# Patient Record
Sex: Female | Born: 1990 | Race: Black or African American | Hispanic: No | Marital: Single | Smoking: Former smoker
Health system: Southern US, Community
[De-identification: ages and names within clinical notes are randomized; demographics above are authoritative.]

## PROBLEM LIST (undated history)

## (undated) ENCOUNTER — Inpatient Hospital Stay: Payer: Self-pay

## (undated) DIAGNOSIS — Z789 Other specified health status: Secondary | ICD-10-CM

## (undated) HISTORY — PX: NO PAST SURGERIES: SHX2092

---
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
## 2012-02-29 ENCOUNTER — Emergency Department (HOSPITAL_COMMUNITY)
Admission: EM | Admit: 2012-02-29 | Discharge: 2012-02-29 | Disposition: A | Discharge status: Home / Self Care | Payer: 59 | Attending: Emergency Medicine | Admitting: Emergency Medicine

## 2012-02-29 ENCOUNTER — Emergency Department (HOSPITAL_COMMUNITY): Payer: 59

## 2012-02-29 ENCOUNTER — Encounter (HOSPITAL_COMMUNITY): Payer: Self-pay | Admitting: Emergency Medicine

## 2012-02-29 DIAGNOSIS — S62339A Displaced fracture of neck of unspecified metacarpal bone, initial encounter for closed fracture: Secondary | ICD-10-CM

## 2012-02-29 DIAGNOSIS — S62309A Unspecified fracture of unspecified metacarpal bone, initial encounter for closed fracture: Secondary | ICD-10-CM | POA: Insufficient documentation

## 2012-02-29 DIAGNOSIS — F17200 Nicotine dependence, unspecified, uncomplicated: Secondary | ICD-10-CM | POA: Insufficient documentation

## 2012-02-29 DIAGNOSIS — X838XXA Intentional self-harm by other specified means, initial encounter: Secondary | ICD-10-CM | POA: Insufficient documentation

## 2012-02-29 MED ORDER — HYDROCODONE-ACETAMINOPHEN 5-325 MG PO TABS: 1.0000 | ORAL_TABLET | ORAL | Status: AC | PRN
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
## 2012-02-29 NOTE — ED Provider Notes (Signed)
Medical screening examination/treatment/procedure(s) were performed by non-physician practitioner and as supervising physician I was immediately available for consultation/collaboration.   Hurman Horn, MD 02/29/12 2051

## 2012-06-24 NOTE — L&D Delivery Note (Signed)
Delivery Note  At 3:02 PM a viable and healthy female [facility])  was delivered via Vaginal, Spontaneous Delivery (Presentation:Vertex,  Right Occiput Anterior).  APGAR: 8, 3; weight .   Placenta status: Intact, .  Cord: 3 vessels with the following complications: Apneic episode after delivery.    Anesthesia: Epidural  Episiotomy: None Lacerations: None Est. Blood Loss (mL): 300  Mom to postpartum.  Baby to Couplet care / Skin to Skin.  Catherine Bonilla V 04/29/2013, 3:40 PM

## 2012-12-03 LAB — OB RESULTS CONSOLE HGB/HCT, BLOOD
HCT: 31 %
Hemoglobin: 10.1 g/dL

## 2012-12-03 LAB — OB RESULTS CONSOLE HEPATITIS B SURFACE ANTIGEN: Hepatitis B Surface Ag: NEGATIVE

## 2012-12-03 LAB — OB RESULTS CONSOLE GC/CHLAMYDIA: Gonorrhea: NEGATIVE

## 2012-12-03 LAB — OB RESULTS CONSOLE RUBELLA ANTIBODY, IGM: Rubella: IMMUNE

## 2012-12-03 LAB — OB RESULTS CONSOLE ABO/RH

## 2013-01-04 LAB — OB RESULTS CONSOLE GC/CHLAMYDIA
Chlamydia: NEGATIVE
Gonorrhea: NEGATIVE

## 2013-01-10 ENCOUNTER — Encounter (HOSPITAL_COMMUNITY): Payer: Self-pay | Admitting: *Deleted

## 2013-01-10 DIAGNOSIS — F17200 Nicotine dependence, unspecified, uncomplicated: Secondary | ICD-10-CM | POA: Insufficient documentation

## 2013-01-10 DIAGNOSIS — O219 Vomiting of pregnancy, unspecified: Secondary | ICD-10-CM | POA: Insufficient documentation

## 2013-01-10 LAB — COMPREHENSIVE METABOLIC PANEL
Albumin: 3.2 g/dL — ABNORMAL LOW (ref 3.5–5.2)
BUN: 13 mg/dL (ref 6–23)
Calcium: 9 mg/dL (ref 8.4–10.5)
Chloride: 102 mEq/L (ref 96–112)
Creatinine, Ser: 0.48 mg/dL — ABNORMAL LOW (ref 0.50–1.10)
Total Bilirubin: 0.4 mg/dL (ref 0.3–1.2)
Total Protein: 7.4 g/dL (ref 6.0–8.3)

## 2013-01-10 LAB — CBC WITH DIFFERENTIAL/PLATELET
Eosinophils Relative: 0 % (ref 0–5)
Hemoglobin: 11.1 g/dL — ABNORMAL LOW (ref 12.0–15.0)
Lymphocytes Relative: 12 % (ref 12–46)
Lymphs Abs: 1.3 10*3/uL (ref 0.7–4.0)
MCV: 84.1 fL (ref 78.0–100.0)
Platelets: 186 10*3/uL (ref 150–400)
RBC: 3.84 MIL/uL — ABNORMAL LOW (ref 3.87–5.11)
WBC: 11 10*3/uL — ABNORMAL HIGH (ref 4.0–10.5)

## 2013-01-10 NOTE — ED Notes (Signed)
Pt c/o n/v x 3 since 1300. Pt states [redacted] weeks pregnant, will be second child, with no miscarriages.

## 2013-01-11 ENCOUNTER — Emergency Department (HOSPITAL_COMMUNITY)
Admission: EM | Admit: 2013-01-11 | Discharge: 2013-01-11 | Discharge status: Against Medical Advice | Payer: Medicaid Other | Attending: Emergency Medicine | Admitting: Emergency Medicine

## 2013-01-11 NOTE — ED Notes (Signed)
Pts name called to go to a room no answer 

## 2013-01-11 NOTE — ED Notes (Signed)
No answer, not in w/r, b/r, entryway, triage or h/w, no answer, unable to find.

## 2013-04-09 LAB — OB RESULTS CONSOLE GBS: GBS: NEGATIVE

## 2013-04-29 ENCOUNTER — Inpatient Hospital Stay (HOSPITAL_COMMUNITY): Payer: Medicaid Other | Admitting: Anesthesiology

## 2013-04-29 ENCOUNTER — Inpatient Hospital Stay (HOSPITAL_COMMUNITY)
Admission: AD | Admit: 2013-04-29 | Discharge: 2013-05-01 | DRG: 775 | Disposition: A | Discharge status: Home / Self Care | Payer: Medicaid Other | Source: Ambulatory Visit | Attending: Obstetrics and Gynecology | Admitting: Obstetrics and Gynecology

## 2013-04-29 ENCOUNTER — Encounter (HOSPITAL_COMMUNITY): Payer: Self-pay

## 2013-04-29 ENCOUNTER — Encounter (HOSPITAL_COMMUNITY): Payer: Medicaid Other | Admitting: Anesthesiology

## 2013-04-29 DIAGNOSIS — R0681 Apnea, not elsewhere classified: Secondary | ICD-10-CM | POA: Diagnosis not present

## 2013-04-29 DIAGNOSIS — O99893 Other specified diseases and conditions complicating puerperium: Principal | ICD-10-CM | POA: Diagnosis not present

## 2013-04-29 HISTORY — DX: Other specified health status: Z78.9

## 2013-04-29 LAB — CBC
HCT: 30.2 % — ABNORMAL LOW (ref 36.0–46.0)
MCH: 26.3 pg (ref 26.0–34.0)
MCV: 78.6 fL (ref 78.0–100.0)
RBC: 3.84 MIL/uL — ABNORMAL LOW (ref 3.87–5.11)
RDW: 14.7 % (ref 11.5–15.5)
WBC: 12 10*3/uL — ABNORMAL HIGH (ref 4.0–10.5)

## 2013-04-29 LAB — RPR: RPR Ser Ql: NONREACTIVE

## 2013-04-29 LAB — TYPE AND SCREEN: Antibody Screen: NEGATIVE

## 2013-04-29 MED ORDER — SENNOSIDES-DOCUSATE SODIUM 8.6-50 MG PO TABS
2.0000 | ORAL_TABLET | ORAL | Status: DC
  Administered 2013-04-30: 2 via ORAL
  Filled 2013-04-29: qty 2

## 2013-04-29 MED ORDER — OXYTOCIN BOLUS FROM INFUSION: 500.0000 mL | INTRAVENOUS | Status: DC

## 2013-04-29 MED ORDER — IBUPROFEN 600 MG PO TABS
600.0000 mg | ORAL_TABLET | Freq: Four times a day (QID) | ORAL | Status: DC
  Administered 2013-04-30 – 2013-05-01 (×6): 600 mg via ORAL
  Filled 2013-04-29 (×7): qty 1

## 2013-04-29 MED ORDER — ONDANSETRON HCL 4 MG PO TABS: 4.0000 mg | ORAL_TABLET | ORAL | Status: DC | PRN

## 2013-04-29 MED ORDER — SIMETHICONE 80 MG PO CHEW: 80.0000 mg | CHEWABLE_TABLET | ORAL | Status: DC | PRN

## 2013-04-29 MED ORDER — ONDANSETRON HCL 4 MG/2ML IJ SOLN: 4.0000 mg | INTRAMUSCULAR | Status: DC | PRN

## 2013-04-29 MED ORDER — ACETAMINOPHEN 325 MG PO TABS: 650.0000 mg | ORAL_TABLET | ORAL | Status: DC | PRN

## 2013-04-29 MED ORDER — FENTANYL 2.5 MCG/ML BUPIVACAINE 1/10 % EPIDURAL INFUSION (WH - ANES)
INTRAMUSCULAR | Status: DC | PRN
  Administered 2013-04-29: 14 mL/h via EPIDURAL

## 2013-04-29 MED ORDER — EPHEDRINE 5 MG/ML INJ
10.0000 mg | INTRAVENOUS | Status: DC | PRN
  Filled 2013-04-29: qty 4
  Filled 2013-04-29: qty 2

## 2013-04-29 MED ORDER — FENTANYL CITRATE 0.05 MG/ML IJ SOLN
100.0000 ug | INTRAMUSCULAR | Status: DC | PRN
  Administered 2013-04-29: 100 ug via INTRAVENOUS
  Filled 2013-04-29: qty 2

## 2013-04-29 MED ORDER — PHENYLEPHRINE 40 MCG/ML (10ML) SYRINGE FOR IV PUSH (FOR BLOOD PRESSURE SUPPORT)
80.0000 ug | PREFILLED_SYRINGE | INTRAVENOUS | Status: DC | PRN
  Filled 2013-04-29: qty 2

## 2013-04-29 MED ORDER — DIPHENHYDRAMINE HCL 25 MG PO CAPS: 25.0000 mg | ORAL_CAPSULE | Freq: Four times a day (QID) | ORAL | Status: DC | PRN

## 2013-04-29 MED ORDER — SENNOSIDES-DOCUSATE SODIUM 8.6-50 MG PO TABS: 2.0000 | ORAL_TABLET | ORAL | Status: DC

## 2013-04-29 MED ORDER — OXYCODONE-ACETAMINOPHEN 5-325 MG PO TABS: 1.0000 | ORAL_TABLET | ORAL | Status: DC | PRN

## 2013-04-29 MED ORDER — MEDROXYPROGESTERONE ACETATE 150 MG/ML IM SUSP: 150.0000 mg | INTRAMUSCULAR | Status: DC | PRN

## 2013-04-29 MED ORDER — DIPHENHYDRAMINE HCL 50 MG/ML IJ SOLN: 12.5000 mg | INTRAMUSCULAR | Status: DC | PRN

## 2013-04-29 MED ORDER — WITCH HAZEL-GLYCERIN EX PADS: 1.0000 "application " | MEDICATED_PAD | CUTANEOUS | Status: DC | PRN

## 2013-04-29 MED ORDER — FLEET ENEMA 7-19 GM/118ML RE ENEM: 1.0000 | ENEMA | Freq: Every day | RECTAL | Status: DC | PRN

## 2013-04-29 MED ORDER — PHENYLEPHRINE 40 MCG/ML (10ML) SYRINGE FOR IV PUSH (FOR BLOOD PRESSURE SUPPORT)
80.0000 ug | PREFILLED_SYRINGE | INTRAVENOUS | Status: DC | PRN
  Filled 2013-04-29: qty 2
  Filled 2013-04-29: qty 10

## 2013-04-29 MED ORDER — PRENATAL MULTIVITAMIN CH
1.0000 | ORAL_TABLET | Freq: Every day | ORAL | Status: DC
  Administered 2013-04-30: 1 via ORAL
  Filled 2013-04-29: qty 1

## 2013-04-29 MED ORDER — ZOLPIDEM TARTRATE 5 MG PO TABS: 5.0000 mg | ORAL_TABLET | Freq: Every evening | ORAL | Status: DC | PRN

## 2013-04-29 MED ORDER — LANOLIN HYDROUS EX OINT: TOPICAL_OINTMENT | CUTANEOUS | Status: DC | PRN

## 2013-04-29 MED ORDER — MEASLES, MUMPS & RUBELLA VAC ~~LOC~~ INJ: 0.5000 mL | INJECTION | Freq: Once | SUBCUTANEOUS | Status: DC

## 2013-04-29 MED ORDER — OXYTOCIN 40 UNITS IN LACTATED RINGERS INFUSION - SIMPLE MED: 62.5000 mL/h | INTRAVENOUS | Status: DC

## 2013-04-29 MED ORDER — CITRIC ACID-SODIUM CITRATE 334-500 MG/5ML PO SOLN: 30.0000 mL | ORAL | Status: DC | PRN

## 2013-04-29 MED ORDER — BENZOCAINE-MENTHOL 20-0.5 % EX AERO: 1.0000 "application " | INHALATION_SPRAY | CUTANEOUS | Status: DC | PRN

## 2013-04-29 MED ORDER — ONDANSETRON HCL 4 MG/2ML IJ SOLN: 4.0000 mg | Freq: Four times a day (QID) | INTRAMUSCULAR | Status: DC | PRN

## 2013-04-29 MED ORDER — BISACODYL 10 MG RE SUPP: 10.0000 mg | Freq: Every day | RECTAL | Status: DC | PRN

## 2013-04-29 MED ORDER — DIBUCAINE 1 % RE OINT: 1.0000 "application " | TOPICAL_OINTMENT | RECTAL | Status: DC | PRN

## 2013-04-29 MED ORDER — LACTATED RINGERS IV SOLN: 500.0000 mL | Freq: Once | INTRAVENOUS | Status: DC

## 2013-04-29 MED ORDER — TERBUTALINE SULFATE 1 MG/ML IJ SOLN: 0.2500 mg | Freq: Once | INTRAMUSCULAR | Status: DC | PRN

## 2013-04-29 MED ORDER — LIDOCAINE HCL (PF) 1 % IJ SOLN
INTRAMUSCULAR | Status: DC | PRN
  Administered 2013-04-29 (×2): 9 mL

## 2013-04-29 MED ORDER — FENTANYL 2.5 MCG/ML BUPIVACAINE 1/10 % EPIDURAL INFUSION (WH - ANES)
14.0000 mL/h | INTRAMUSCULAR | Status: DC | PRN
  Filled 2013-04-29 (×2): qty 125

## 2013-04-29 MED ORDER — OXYTOCIN 40 UNITS IN LACTATED RINGERS INFUSION - SIMPLE MED
1.0000 m[IU]/min | INTRAVENOUS | Status: DC
  Administered 2013-04-29: 2 m[IU]/min via INTRAVENOUS
  Filled 2013-04-29: qty 1000

## 2013-04-29 MED ORDER — LACTATED RINGERS IV SOLN: 500.0000 mL | INTRAVENOUS | Status: DC | PRN

## 2013-04-29 MED ORDER — IBUPROFEN 600 MG PO TABS: 600.0000 mg | ORAL_TABLET | Freq: Four times a day (QID) | ORAL | Status: DC

## 2013-04-29 MED ORDER — IBUPROFEN 600 MG PO TABS
600.0000 mg | ORAL_TABLET | Freq: Four times a day (QID) | ORAL | Status: DC | PRN
  Administered 2013-04-29: 600 mg via ORAL
  Filled 2013-04-29: qty 1

## 2013-04-29 MED ORDER — LACTATED RINGERS IV SOLN
INTRAVENOUS | Status: DC
  Administered 2013-04-29: 06:00:00 via INTRAVENOUS

## 2013-04-29 MED ORDER — OXYCODONE-ACETAMINOPHEN 5-325 MG PO TABS
1.0000 | ORAL_TABLET | ORAL | Status: DC | PRN
  Administered 2013-04-30: 1 via ORAL
  Administered 2013-04-30: 2 via ORAL
  Filled 2013-04-29: qty 1
  Filled 2013-04-29: qty 2

## 2013-04-29 MED ORDER — TETANUS-DIPHTH-ACELL PERTUSSIS 5-2.5-18.5 LF-MCG/0.5 IM SUSP: 0.5000 mL | Freq: Once | INTRAMUSCULAR | Status: DC

## 2013-04-29 MED ORDER — EPHEDRINE 5 MG/ML INJ
10.0000 mg | INTRAVENOUS | Status: DC | PRN
  Filled 2013-04-29: qty 2

## 2013-04-29 MED ORDER — OXYCODONE-ACETAMINOPHEN 5-325 MG PO TABS
1.0000 | ORAL_TABLET | ORAL | Status: DC | PRN
  Administered 2013-04-29: 2 via ORAL
  Filled 2013-04-29: qty 2

## 2013-04-29 MED ORDER — LIDOCAINE HCL (PF) 1 % IJ SOLN
30.0000 mL | INTRAMUSCULAR | Status: DC | PRN
  Filled 2013-04-29 (×2): qty 30

## 2013-04-29 MED ORDER — PRENATAL MULTIVITAMIN CH: 1.0000 | ORAL_TABLET | Freq: Every day | ORAL | Status: DC

## 2013-04-29 NOTE — Lactation Note (Signed)
This note was copied from the chart of Catherine Bonilla. Lactation Consultation Note  Patient Name: Catherine Sharnette Kitamura NWGNF'A Date: 04/29/2013 Reason for consult: Initial assessment of this mom and baby at 7 hours after delivery. Mom is about to go to sleep and baby was recently fed formula for a low blood sugar but had previously latched well to breast.  Initial LATCH score=9 per RN assessment.  LC encouraged STS and cue feedings. Mom states she was taught how to hand express by her nurse.LC encouraged review of Baby and Me pp 14 and 20-25 for STS and BF information. LC provided [employer] brochure and reviewed [facility] services and list of community and web site resources.     Maternal Data Formula Feeding for Exclusion: No Infant to breast within first hour of birth: Yes (initial LATCH score=9) Has patient been taught Hand Expression?: Yes (mom states her nurse showed her hand expression) Does the patient have breastfeeding experience prior to this delivery?: No  Feeding Feeding Type: Formula Length of feed: 5 min  LATCH Score/Interventions Latch: Repeated attempts needed to sustain latch, nipple held in mouth throughout feeding, stimulation needed to elicit sucking reflex. Intervention(s): Adjust position;Assist with latch  Audible Swallowing: A few with stimulation Intervention(s): Skin to skin  Type of Nipple: Flat (but compressible) Intervention(s): No intervention needed  Comfort (Breast/Nipple): Soft / non-tender     Hold (Positioning): Assistance needed to correctly position infant at breast and maintain latch. Intervention(s): Breastfeeding basics reviewed;Support Pillows;Position options;Skin to skin  LATCH Score: 6 (previous assessment per nurse)  Lactation Tools Discussed/Used   STS, hand expression, cue feedings  Consult Status Consult Status: Follow-up Date: 04/30/13 Follow-up type: In-patient    Warrick Parisian [facility] 04/29/2013, 10:49 PM

## 2013-04-29 NOTE — Progress Notes (Signed)
22 y.o. year old female,at [redacted]w[redacted]d gestation.  SUBJECTIVE:  Comfortable with epidural.  OBJECTIVE:  BP 122/72  Pulse 70  Temp(Src) 97.8 F (36.6 C) (Oral)  Resp 18  Ht 5\' 3"  (1.6 m)  Wt 127 lb 9.6 oz (57.879 kg)  BMI 22.61 kg/m2  SpO2 100%  Fetal Heart Tones:  Category 2  Contractions:          Firm  Cervix is now 6-7 cm. Intrauterine pressure catheter placed. We'll begin amnioinfusion. Cesarean section discussed with the patient and family. Risk and benefits were reviewed.  ASSESSMENT:  [redacted]w[redacted]d Weeks Pregnancy  Fetal heart tones category 2.  PLAN:  Amnioinfusion.  Pitocin has now been started. Fetal heart tones continued to be category 2. We're still hoping for a vaginal delivery.  Leonard Schwartz, M.D.

## 2013-04-29 NOTE — MAU Note (Signed)
Pt c/o uc that started at 9pm and are every 9 mins apart.

## 2013-04-29 NOTE — MAU Note (Signed)
Recheck pt cervix in 1 hour per Gevena Barre CNM

## 2013-04-29 NOTE — Progress Notes (Signed)
Gevena Barre, CNM given update regarding pt arrival & status.  CNM informed pt cervix 4.5 cms, given IV pain meds, & will want epidural.  CNM verbalized understanding

## 2013-04-29 NOTE — H&P (Signed)
Catherine Bonilla is a 22 y.o. female presenting for labor, denies VB or LOF, GFM, requests epidural.   Pregnancy course: Pt began PNC at [facility] at 19wks, +chlamydia and tx'd, TOC neg  Anatomy US normal  Quad screen WNl  [place] at 32wks noted AC lag F/u growth [place] at 34wks WNL EFW 54% and no AC lag GBS/GC/CT neg at 36wks   Maternal Medical History:  Reason for admission: Contractions.   Contractions: Onset was 3-5 hours ago.   Frequency: regular.   Perceived severity is mild.    Fetal activity: Perceived fetal activity is normal.   Last perceived fetal movement was within the past hour.    Prenatal complications: no prenatal complications Prenatal Complications - Diabetes: none.    OB History   Grav Para Term Preterm Abortions TAB SAB Ect Mult Living   2 1 1       1      No past medical history on file. History reviewed. No pertinent past surgical history. Family History: family history is not on file. Social History:  reports that she has quit smoking. She has never used smokeless tobacco. She reports that she does not drink alcohol or use illicit drugs.   Prenatal Transfer Tool  Maternal Diabetes: No Genetic Screening: Normal Maternal Ultrasounds/Referrals: Normal Fetal Ultrasounds or other Referrals:  None Maternal Substance Abuse:  No Significant Maternal Medications:  None Significant Maternal Lab Results:  Lab values include: Group B Strep negative Other Comments:  None  Review of Systems  All other systems reviewed and are negative.    Dilation: 3.5 Effacement (%): 90;100 Station: -2;-1 Exam by:: D Simpson RN Blood pressure 121/76, pulse 76, temperature 98.6 F (37 C), temperature source Oral, resp. rate 18, height 5\' 3"  (1.6 Bonilla), weight 127 lb 9.6 oz (57.879 kg), SpO2 100.00%. Maternal Exam:  Uterine Assessment: Contraction strength is mild.  Contraction frequency is regular.   Abdomen: Patient reports no abdominal tenderness. Fundal height is aga.   Estimated  fetal weight is 7-8#.   Fetal presentation: vertex  Introitus: Normal vulva. Normal vagina.  Cervix: Cervix evaluated by digital exam.     Fetal Exam Fetal Monitor Review: Mode: ultrasound.    Fetal State Assessment: Category I - tracings are normal.     Physical Exam  Nursing note and vitals reviewed. Constitutional: She is oriented to person, place, and time. She appears well-developed and well-nourished.  HENT:  Head: Normocephalic.  Eyes: Pupils are equal, round, and reactive to light.  Neck: Normal range of motion.  Cardiovascular: Normal rate, regular rhythm and normal heart sounds.   Respiratory: Effort normal and breath sounds normal.  GI: Soft. Bowel sounds are normal.  Genitourinary: Vagina normal.  Musculoskeletal: Normal range of motion.  Neurological: She is alert and oriented to person, place, and time. She has normal reflexes.  Skin: Skin is warm and dry.  Psychiatric: She has a normal mood and affect. Her behavior is normal.    Prenatal labs: ABO, Rh: O/Positive/-- (06/12 0000) Antibody:   neg Rubella: Immune (06/12 0000) RPR: Nonreactive (06/12 0000)  HBsAg: Negative (06/12 0000)  HIV: Non-reactive (06/12 0000)  GBS: Negative (10/17 0000)   Assessment/Plan: IUP at [redacted]w[redacted]d Early labor GBS neg FHR cat 1  Admit to b.s per c/w Dr Su Hilt Routine L&D orders Expectant mgmnt     Catherine Bonilla 04/29/2013, 4:10 AM

## 2013-04-29 NOTE — Anesthesia Procedure Notes (Signed)
Epidural Patient location during procedure: OB Start time: 04/29/2013 8:02 AM End time: 04/29/2013 8:06 AM  Staffing Anesthesiologist: Leilani Able Performed by: anesthesiologist   Preanesthetic Checklist Completed: patient identified, site marked, surgical consent, pre-op evaluation, timeout performed, IV checked, risks and benefits discussed and monitors and equipment checked  Epidural Patient position: sitting Prep: site prepped and draped and DuraPrep Patient monitoring: continuous pulse ox and blood pressure Approach: midline Injection technique: LOR air  Needle:  Needle type: Tuohy  Needle gauge: 17 G Needle length: 9 cm and 9 Needle insertion depth: 5 cm cm Catheter type: closed end flexible Catheter size: 19 Gauge Catheter at skin depth: 10 cm Test dose: negative and Other  Assessment Sensory level: T9 Events: blood not aspirated, injection not painful, no injection resistance, negative IV test and no paresthesia

## 2013-04-29 NOTE — Progress Notes (Signed)
Lab tech into draw blood secondary original specimens inadequate to run

## 2013-04-29 NOTE — Progress Notes (Signed)
22 y.o. year old female,at [redacted]w[redacted]d gestation.  SUBJECTIVE:  The patient's pain is better with an epidural.  OBJECTIVE:  BP 101/65  Pulse 79  Temp(Src) 97.8 F (36.6 C) (Oral)  Resp 18  Ht 5\' 3"  (1.6 m)  Wt 127 lb 9.6 oz (57.879 kg)  BMI 22.61 kg/m2  SpO2 100%  Fetal Heart Tones:  Category 1  Contractions:          Regular  Cervix is 7 cm, 80% effaced, -2.\  Membranes ruptured. Fluid clear.  ASSESSMENT:  [redacted]w[redacted]d Weeks Pregnancy  Active labor  PLAN:  Anticipate a vaginal delivery.  Leonard Schwartz, M.D.

## 2013-04-29 NOTE — MAU Note (Signed)
Pt may come off monitor per Gevena Barre CNM

## 2013-04-29 NOTE — Anesthesia Preprocedure Evaluation (Addendum)
Anesthesia Evaluation  Patient identified by MRN, date of birth, ID band Patient awake    Reviewed: Allergy & Precautions, H&P , NPO status , Patient's Chart, lab work & pertinent test results  Airway Mallampati: I TM Distance: >3 FB Neck ROM: full    Dental no notable dental hx.    Pulmonary neg pulmonary ROS,    Pulmonary exam normal       Cardiovascular negative cardio ROS      Neuro/Psych negative neurological ROS  negative psych ROS   GI/Hepatic negative GI ROS, Neg liver ROS,   Endo/Other  negative endocrine ROS  Renal/GU negative Renal ROS  negative genitourinary   Musculoskeletal negative musculoskeletal ROS (+)   Abdominal Normal abdominal exam  (+)   Peds  Hematology negative hematology ROS (+)   Anesthesia Other Findings   Reproductive/Obstetrics (+) Pregnancy                           Anesthesia Physical  Anesthesia Plan  ASA: II  Anesthesia Plan: Epidural   Post-op Pain Management:    Induction:   Airway Management Planned:   Additional Equipment:   Intra-op Plan:   Post-operative Plan:   Informed Consent: I have reviewed the patients History and Physical, chart, labs and discussed the procedure including the risks, benefits and alternatives for the proposed anesthesia with the patient or authorized representative who has indicated his/her understanding and acceptance.     Plan Discussed with:   Anesthesia Plan Comments:         Anesthesia Quick Evaluation  

## 2013-04-30 ENCOUNTER — Encounter (HOSPITAL_COMMUNITY): Payer: Self-pay

## 2013-04-30 NOTE — Anesthesia Postprocedure Evaluation (Signed)
  Anesthesia Post-op Note  Patient: Catherine Bonilla  Procedure(s) Performed: * No procedures listed *  Patient Location: Mother/Baby  Anesthesia Type:Epidural  Level of Consciousness: awake  Airway and Oxygen Therapy: Patient Spontanous Breathing  Post-op Pain: mild  Post-op Assessment: Patient's Cardiovascular Status Stable, Respiratory Function Stable, No signs of Nausea or vomiting, Pain level controlled, No headache, No residual numbness and No residual motor weakness  Post-op Vital Signs: stable  Complications: No apparent anesthesia complications

## 2013-04-30 NOTE — Progress Notes (Signed)
Post Partum Day 1 Subjective: no complaints, up ad lib, voiding and tolerating PO  Objective: Blood pressure 103/54, pulse 67, temperature 98 F (36.7 C), temperature source Oral, resp. rate 18, height 5\' 3"  (1.6 m), weight 127 lb 9.6 oz (57.879 kg), SpO2 97.00%, unknown if currently breastfeeding.  Physical Exam:  General: alert and cooperative Lochia: appropriate Uterine Fundus: firm Incision: na DVT Evaluation: Negative Homan's sign.   Recent Labs  04/29/13 0640  HGB 10.1*  HCT 30.2*    Assessment/Plan: Plan for discharge tomorrow and Contraception pt considering depoprovera   LOS: 1 day   Derika Eckles A 04/30/2013, 5:36 PM

## 2013-04-30 NOTE — Lactation Note (Signed)
This note was copied from the chart of Boy Deazia Lampi. Lactation Consultation Note  Patient Name: Boy Bluma Buresh WGNFA'O Date: 04/30/2013 Reason for consult: Follow-up assessment of this primipara and her baby at 30 hours post delivery.  Mom informs LC that she has decided to only bottle feed with formula.  Baby is receiving 22 calorie formula and last breastfed last night at 2220.  LC encouraged mom to call for [facility] if needed or changes her mind.   Maternal Data    Feeding    LATCH Score/Interventions        N/A - decided to formula/bottle-feed              Lactation Tools Discussed/Used   LC availability as needed  Consult Status Consult Status: Complete Follow-up type: Other (comment) (mom to contact LC if decides to resume breastfeeding)    Lynda Rainwater 04/30/2013, 10:10 PM

## 2013-05-01 MED ORDER — MEDROXYPROGESTERONE ACETATE 150 MG/ML IM SUSP: 150.0000 mg | INTRAMUSCULAR | Status: AC

## 2013-05-01 MED ORDER — IBUPROFEN 600 MG PO TABS: 600.0000 mg | ORAL_TABLET | Freq: Four times a day (QID) | ORAL | Status: DC

## 2013-05-01 NOTE — Discharge Summary (Signed)
Obstetric Discharge Summary Reason for Admission: onset of labor Prenatal Procedures: NST and ultrasound Intrapartum Procedures: spontaneous vaginal delivery Postpartum Procedures: none Complications-Operative and Postpartum: none Hemoglobin  Date Value Range Status  04/29/2013 10.1* 12.0 - 15.0 g/dL Final  1/61/0960 45.4   Final     HCT  Date Value Range Status  04/29/2013 30.2* 36.0 - 46.0 % Final  12/03/2012 31   Final    Physical Exam:  General: alert and cooperative Lochia: appropriate Uterine Fundus: firm Incision: na DVT Evaluation: No evidence of DVT seen on physical exam.  Discharge Diagnoses: Term Pregnancy-delivered  Discharge Information: Date: 05/01/2013 Activity: pelvic rest Diet: routine Medications: PNV and Ibuprofen Condition: stable Instructions: refer to practice specific booklet Discharge to: home Follow-up Information   Follow up with [facility]. Schedule an appointment as soon as possible for a visit in 6 weeks.   Specialty:  Obstetrics and Gynecology   Contact information:   [address] [place] [address] [phone]      Newborn Data: Live born female  Birth Weight: 5 lb 13.8 oz (2660 g) APGAR: 8, 3  Home with mother.  Kona Lover A 05/01/2013, 11:38 AM

## 2013-06-23 IMAGING — CR DG HAND COMPLETE 3+V*R*
3 series · 3 of 3 positions shown · non-contrast
Comparison: None.

CLINICAL DATA: And pain.  Punched wall.

RIGHT HAND - COMPLETE 3+ VIEW

[x hand pa right]
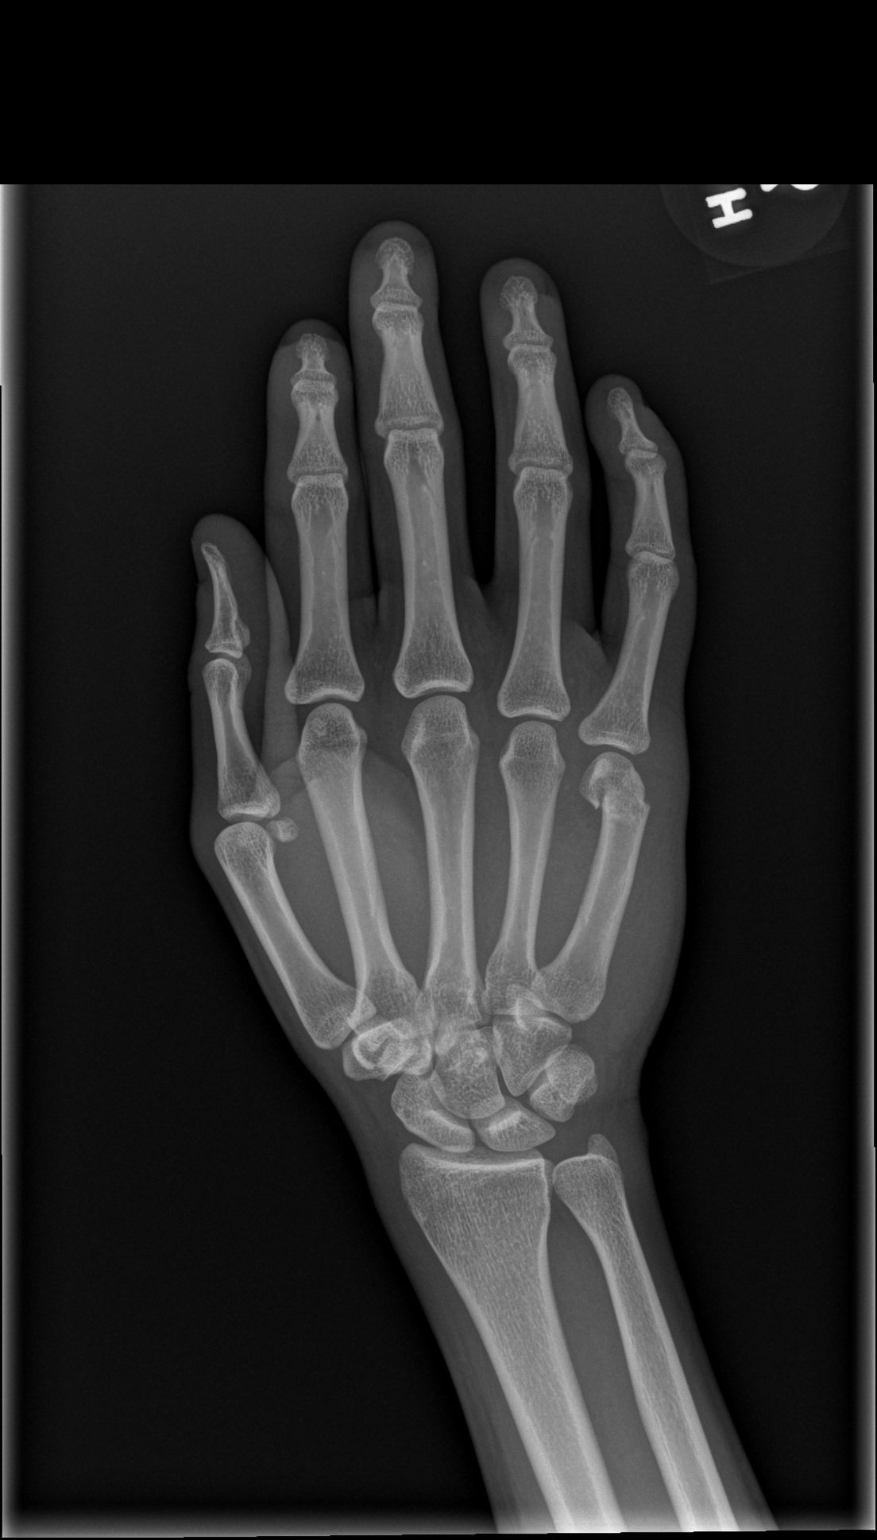

[x hand obl right]
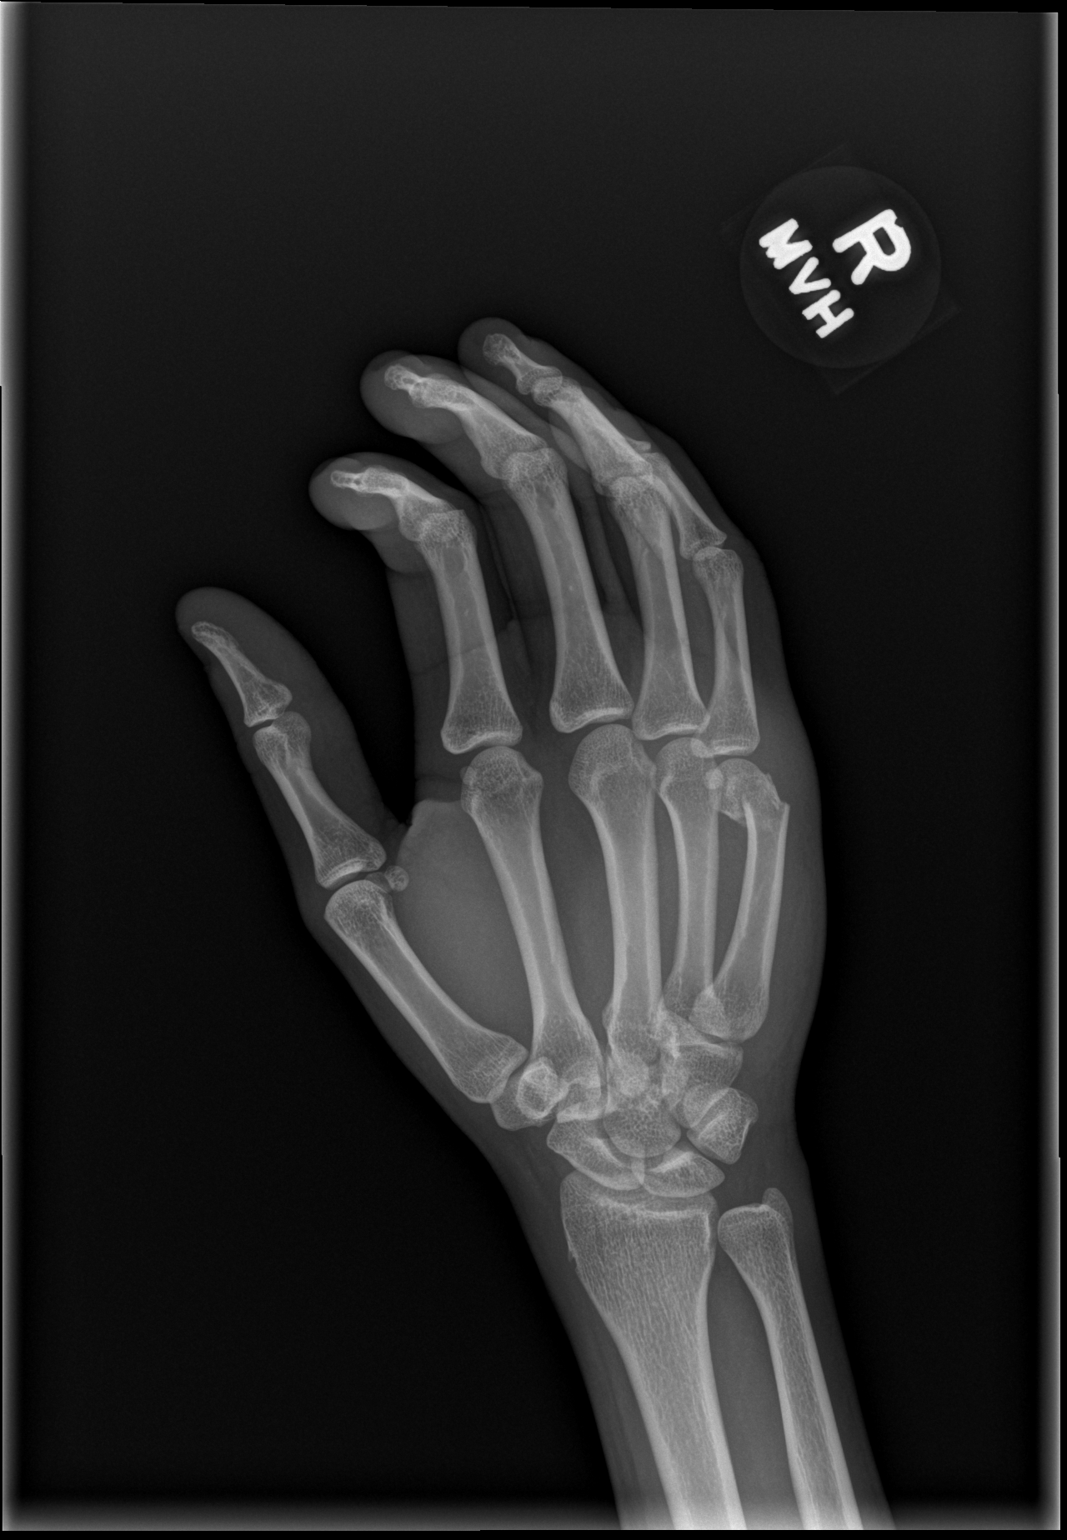

[x hand lat right]
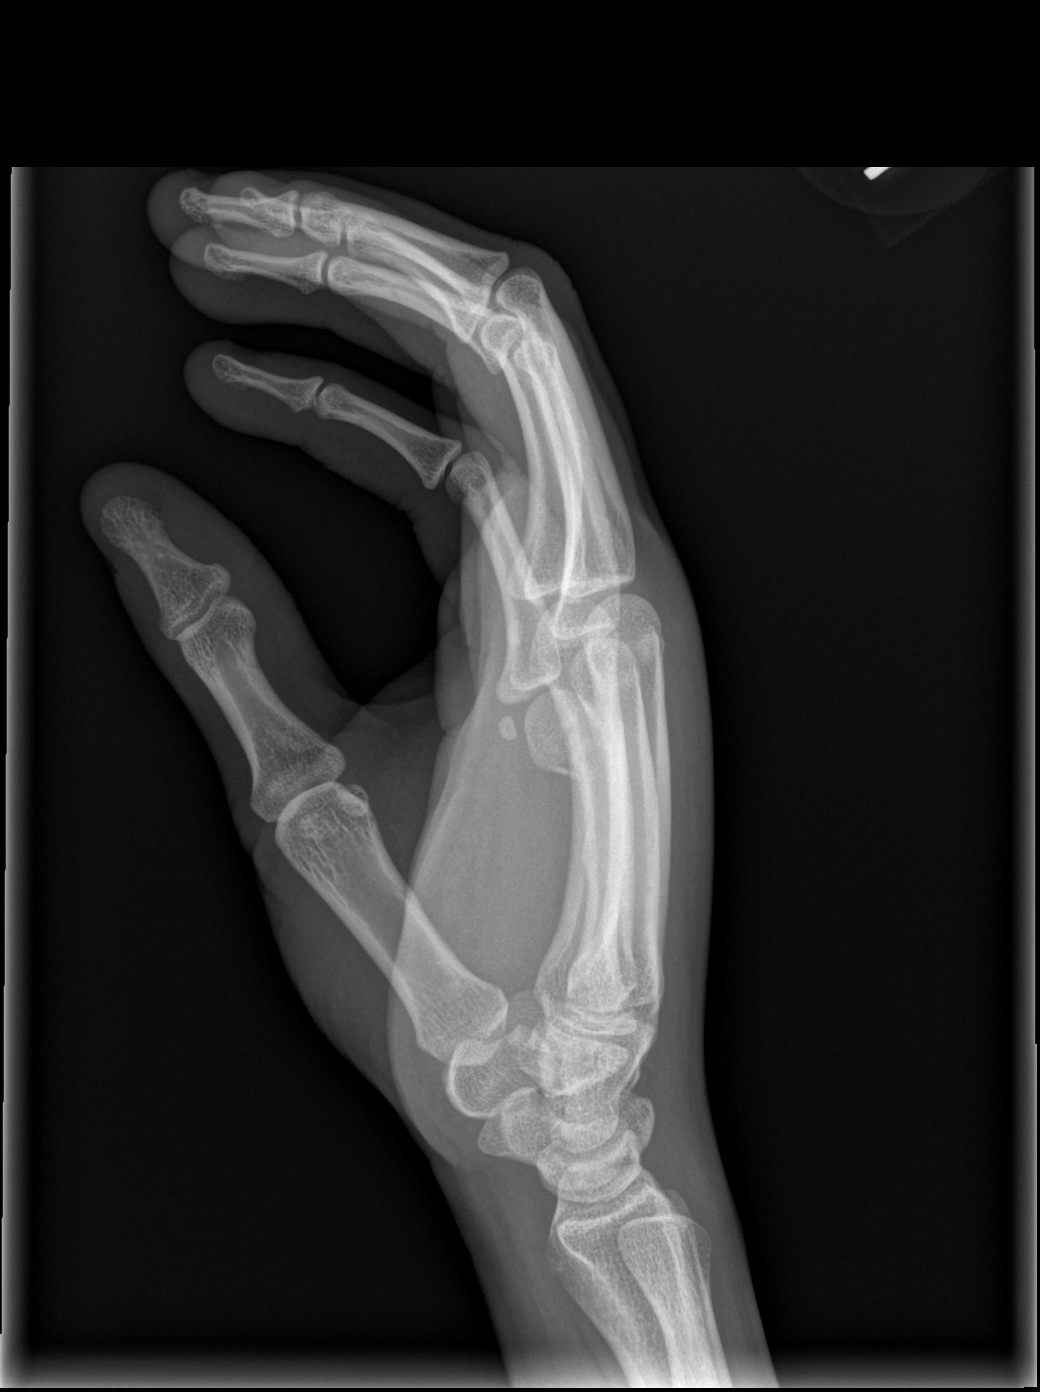

[3 of 3 positions shown; findings below may reference images not displayed]

FINDINGS: There is an acute fracture of the distal fifth
metacarpal, with approximately 45 degrees apex dorsal angulation
and slight impaction.   Joints of the hand are aligned.
IMPRESSION: Acute, angulated fracture of the distal fifth metacarpal.

## 2014-04-25 ENCOUNTER — Encounter (HOSPITAL_COMMUNITY): Payer: Self-pay

## 2014-05-29 ENCOUNTER — Encounter (HOSPITAL_COMMUNITY): Payer: Self-pay | Admitting: *Deleted

## 2014-05-29 ENCOUNTER — Emergency Department (HOSPITAL_COMMUNITY): Payer: 59

## 2014-05-29 ENCOUNTER — Emergency Department (HOSPITAL_COMMUNITY)
Admission: EM | Admit: 2014-05-29 | Discharge: 2014-05-30 | Disposition: A | Discharge status: Home / Self Care | Payer: 59 | Attending: Emergency Medicine | Admitting: Emergency Medicine

## 2014-05-29 DIAGNOSIS — R1013 Epigastric pain: Secondary | ICD-10-CM | POA: Diagnosis not present

## 2014-05-29 DIAGNOSIS — R630 Anorexia: Secondary | ICD-10-CM | POA: Diagnosis not present

## 2014-05-29 DIAGNOSIS — R1012 Left upper quadrant pain: Secondary | ICD-10-CM | POA: Insufficient documentation

## 2014-05-29 DIAGNOSIS — R1011 Right upper quadrant pain: Secondary | ICD-10-CM | POA: Diagnosis not present

## 2014-05-29 DIAGNOSIS — R109 Unspecified abdominal pain: Secondary | ICD-10-CM

## 2014-05-29 DIAGNOSIS — R0781 Pleurodynia: Secondary | ICD-10-CM | POA: Diagnosis present

## 2014-05-29 DIAGNOSIS — R0789 Other chest pain: Secondary | ICD-10-CM | POA: Insufficient documentation

## 2014-05-29 DIAGNOSIS — R0981 Nasal congestion: Secondary | ICD-10-CM | POA: Insufficient documentation

## 2014-05-29 DIAGNOSIS — Z720 Tobacco use: Secondary | ICD-10-CM | POA: Insufficient documentation

## 2014-05-29 NOTE — ED Provider Notes (Signed)
CSN: [id number]     Arrival date & time 05/29/14  2122 History  This chart was scribed for Mellody DrownLauren Suly Vukelich, PA-C, working with Toy CookeyMegan Docherty, MD by Chestine SporeSoijett Blue, ED Scribe. The patient was seen in room TR05C/TR05C at 10:44 PM.    Chief Complaint  Patient presents with  . rib pain      HPI Comments: Catherine Bonilla is a 23 y.o. female who presents to the Emergency Department complaining of rib pain onset 2 days ago. When she takes deep breaths there is sharp intermittent pain in her rib area. Pt reports that she tries not to laugh, sneeze, or breathe deep because of the pain. She states that she is having associated symptoms of lack of appetite, abdominal pain. She didn't take anything for the relief of her symptoms. She denies SOB, congestion, leg swelling, fast heartbeat, nausea, vomiting, diarrhea, fever, swollen lymph nodes, and any other symptoms. Denies having this before.  Denies recent cold. She reports that she has had Mono before and the current symptoms are similar. Denies recent increase in exercise.   The history is provided by the patient. No language interpreter was used.    Past Medical History  Diagnosis Date  . Medical history non-contributory    Past Surgical History  Procedure Laterality Date  . No past surgeries     History reviewed. No pertinent family history. History  Substance Use Topics  . Smoking status: Current Some Day Smoker -- 0.50 packs/day for 4 years    Types: Cigarettes  . Smokeless tobacco: Never Used  . Alcohol Use: No   OB History    Gravida Para Term Preterm AB TAB SAB Ectopic Multiple Living   2 2 2       2      Review of Systems  Constitutional: Negative for fever and appetite change.  HENT: Positive for congestion.   Respiratory: Negative for shortness of breath.   Cardiovascular: Negative for leg swelling.  Gastrointestinal: Positive for abdominal pain. Negative for nausea, vomiting and diarrhea.  Musculoskeletal: Positive for  arthralgias.  Hematological: Negative for adenopathy.      Allergies  Review of patient's allergies indicates no known allergies.  Home Medications   Prior to Admission medications   Medication Sig Start Date End Date Taking? Authorizing Provider  acetaminophen (TYLENOL) 500 MG tablet Take 500 mg by mouth every 6 (six) hours as needed for mild pain or moderate pain.    Historical Provider, MD  ibuprofen (ADVIL,MOTRIN) 600 MG tablet Take 1 tablet (600 mg total) by mouth every 6 (six) hours. 05/01/13   Jaymes GraffNaima Dillard, MD  medroxyPROGESTERone (DEPO-PROVERA) 150 MG/ML injection Inject 1 mL (150 mg total) into the muscle every 3 (three) months. 05/01/13   Naima Dillard, MD  ondansetron (ZOFRAN-ODT) 4 MG disintegrating tablet Take 4 mg by mouth every 8 (eight) hours as needed for nausea or vomiting.    Historical Provider, MD  Prenatal Vit-Fe Fumarate-FA (PRENATAL MULTIVITAMIN) TABS Take 1 tablet by mouth daily at 12 noon.    Historical Provider, MD   BP 133/65 mmHg  Pulse 82  Temp(Src) 98.4 F (36.9 C)  Resp 18  Ht 5' 2.5" (1.588 m)  Wt 115 lb (52.164 kg)  BMI 20.69 kg/m2  SpO2 99%  LMP 05/13/2014 Physical Exam  Constitutional: She is oriented to person, place, and time. She appears well-developed and well-nourished. No distress.  HENT:  Head: Normocephalic and atraumatic.  Eyes: EOM are normal.  Neck: Neck supple.  Cardiovascular: Normal  rate and regular rhythm.   No murmur heard. Pulmonary/Chest: Effort normal and breath sounds normal. No respiratory distress. She has no wheezes. She has no rales.  No tenderness to palpation of anterior chest wall/ribs. Pt is speaking in complete sentences.    Abdominal: She exhibits no distension. There is tenderness in the right upper quadrant, epigastric area and left upper quadrant. There is no rebound and no guarding.  Musculoskeletal: Normal range of motion.  Lymphadenopathy:       Head (right side): No submental, no submandibular and no  tonsillar adenopathy present.       Head (left side): No submental, no submandibular and no tonsillar adenopathy present.       Right cervical: No superficial cervical adenopathy present.      Left cervical: No superficial cervical adenopathy present.  Neurological: She is alert and oriented to person, place, and time.  Skin: Skin is warm and dry.  Psychiatric: She has a normal mood and affect. Her behavior is normal.  Nursing note and vitals reviewed.   ED Course  Procedures (including critical care time) COORDINATION OF CARE: 10:44 PM-Discussed treatment plan which includes mono and chest XR with pt at bedside and pt agreed to plan.   Labs Review Labs Reviewed  MONONUCLEOSIS SCREEN    Imaging Review Dg Chest 2 View  05/29/2014   CLINICAL DATA:  Initial evaluation for bilateral rib pain when coughing, radiating into neck, for 2 days  EXAM: CHEST  2 VIEW  COMPARISON:  None.  FINDINGS: The heart size and mediastinal contours are within normal limits. Both lungs are clear. The visualized skeletal structures are unremarkable.  IMPRESSION: No active cardiopulmonary disease.   Electronically Signed   By: Esperanza Heiraymond  Rubner M.D.   On: 05/29/2014 22:00     EKG Interpretation None      MDM   Final diagnoses:  Chest wall pain  Abdominal discomfort   Patient presents with bilateral upper abdominal discomfort. Chest X-ray negative. Strep negative. No obvious reason for discomfort. Plan to treat conservatively with anti-inflammatories and pain medication. Follow-up with PCP. Discussed lab results, imaging results, and treatment plan with the patient. Return precautions given. Reports understanding and no other concerns at this time.  Patient is stable for discharge at this time. Meds given in ED:  Medications - No data to display  Discharge Medication List as of 05/30/2014 12:18 AM    START taking these medications   Details  HYDROcodone-acetaminophen (NORCO/VICODIN) 5-325 MG per tablet  Take 1 tablet by mouth every 4 (four) hours as needed for moderate pain or severe pain., Starting 05/30/2014, Until Discontinued, Print    !! ibuprofen (ADVIL,MOTRIN) 800 MG tablet Take 1 tablet (800 mg total) by mouth 3 (three) times daily., Starting 05/30/2014, Until Discontinued, Print     !! - Potential duplicate medications found. Please discuss with provider.     I personally performed the services described in this documentation, which was scribed in my presence. The recorded information has been reviewed and is accurate.    Mellody DrownLauren Misaki Sozio, PA-C 05/30/14 [id number]  Toy CookeyMegan Docherty, MD 05/30/14 [phone]

## 2014-05-29 NOTE — ED Notes (Signed)
Pt reports rib cage pain that started on Friday after eating. Pt initially thought it was gas but pain didn't go away. Pt reports sharp intermittent pain with deep breathing, sneezing, or laughter. Pt rates pain 8/10. Pt didn't take any otc medications for pain.

## 2014-05-29 NOTE — ED Notes (Signed)
Pt in c/o bilateral side rib cage pain, denies injury, pain is worse with deep breathing or movement, no history of same, no distress noted

## 2014-05-30 LAB — MONONUCLEOSIS SCREEN: Mono Screen: NEGATIVE

## 2014-05-30 MED ORDER — HYDROCODONE-ACETAMINOPHEN 5-325 MG PO TABS: 1.0000 | ORAL_TABLET | ORAL | Status: DC | PRN

## 2014-05-30 MED ORDER — IBUPROFEN 800 MG PO TABS: 800.0000 mg | ORAL_TABLET | Freq: Three times a day (TID) | ORAL | Status: AC

## 2014-05-30 NOTE — Discharge Instructions (Signed)
Call for a follow up appointment with a Family or Primary Care Provider.  °Return if Symptoms worsen.   °Take medication as prescribed.  ° °

## 2015-06-25 NOTE — L&D Delivery Note (Signed)
Delivery Note At 4:11 PM a viable female was delivered via Vaginal, Spontaneous Delivery (Presentation: LOA ;  ).  APGAR: , ; weight 6 lb 8.1 oz (2950 g).   Placenta status: Intact, Spontaneous.  Cord: 3 vessels with the following complications: None.   Anesthesia: Epidural  Episiotomy: None Lacerations:  None Est. Blood Loss (mL): 100cc   Mom to postpartum.  Baby to Couplet care / Skin to Skin.  Leonette MostJohanna K Halfon 11/10/2015, 6:24 PM

## 2015-06-27 LAB — OB RESULTS CONSOLE VARICELLA ZOSTER ANTIBODY, IGG: Varicella: IMMUNE

## 2015-08-22 ENCOUNTER — Observation Stay
Admission: EM | Admit: 2015-08-22 | Discharge: 2015-08-22 | Disposition: A | Discharge status: Home / Self Care | Payer: Medicaid Other | Attending: Obstetrics and Gynecology | Admitting: Obstetrics and Gynecology

## 2015-08-22 DIAGNOSIS — R109 Unspecified abdominal pain: Secondary | ICD-10-CM | POA: Diagnosis not present

## 2015-08-22 DIAGNOSIS — Z3A27 27 weeks gestation of pregnancy: Secondary | ICD-10-CM | POA: Diagnosis not present

## 2015-08-22 DIAGNOSIS — F17210 Nicotine dependence, cigarettes, uncomplicated: Secondary | ICD-10-CM | POA: Insufficient documentation

## 2015-08-22 DIAGNOSIS — O26892 Other specified pregnancy related conditions, second trimester: Secondary | ICD-10-CM | POA: Diagnosis not present

## 2015-08-22 DIAGNOSIS — O99332 Smoking (tobacco) complicating pregnancy, second trimester: Secondary | ICD-10-CM | POA: Insufficient documentation

## 2015-08-22 LAB — URINALYSIS COMPLETE WITH MICROSCOPIC (ARMC ONLY)
BILIRUBIN URINE: NEGATIVE
Bacteria, UA: NONE SEEN
GLUCOSE, UA: NEGATIVE mg/dL
HGB URINE DIPSTICK: NEGATIVE
KETONES UR: NEGATIVE mg/dL
LEUKOCYTES UA: NEGATIVE
NITRITE: NEGATIVE
Protein, ur: NEGATIVE mg/dL
Specific Gravity, Urine: 1.015 (ref 1.005–1.030)
pH: 7 (ref 5.0–8.0)

## 2015-08-22 LAB — FETAL FIBRONECTIN: Fetal Fibronectin: NEGATIVE

## 2015-08-22 LAB — CHLAMYDIA/NGC RT PCR (ARMC ONLY)
Chlamydia Tr: NOT DETECTED
N GONORRHOEAE: NOT DETECTED

## 2015-08-22 NOTE — OB Triage Note (Signed)
Ms. Marshman here with c/o right sided abdominal pain that started around 0800, cannot identify any activity that may have precipitated this pain. Last intercourse sometime last week. Last BM this morning, denies constipation and/or diarrhea. Denies bleeding, ctx, LOF. Positive fetal movement, abdomen soft and slightly tender on right side.

## 2015-08-22 NOTE — OB Triage Provider Note (Signed)
TRIAGE NOTE to rule out Preterm Labor   History of Present Illness: Catherine Bonilla is a 25 y.o. G3P2002 at [redacted]w[redacted]d presenting to triage for rule out preterm labor. Pt reports 6 hours of acute right sided abdominal pain that has now resolved  Patient reports the fetal movement as active. Patient reports uterine contraction  activity as none. Patient reports  vaginal bleeding as none. Patient describes fluid per vagina as None. Fetal presentation is unsure.  Patient Active Problem List   Diagnosis Date Noted  . Labor and delivery, indication for care 08/22/2015  . NSVD (normal spontaneous vaginal delivery) 04/29/2013    Past Medical History  Diagnosis Date  . Medical history non-contributory     Past Surgical History  Procedure Laterality Date  . No past surgeries      OB History  Gravida Para Term Preterm AB SAB TAB Ectopic Multiple Living  # Outcome Date GA Lbr Len/2nd Weight Sex Delivery Anes PTL Lv  3 Current           2 Term 04/29/13 [redacted]w[redacted]d / 00:22 2.66 kg (5 lb 13.8 oz) M Vag-Spont EPI  Y  1 Term     M Vag-Spont EPI  Y      Social History   Social History  . Marital Status: Single    Spouse Name: N/A  . Number of Children: N/A  . Years of Education: N/A   Social History Main Topics  . Smoking status: Current Some Day Smoker -- 0.50 packs/day for 4 years    Types: Cigarettes  . Smokeless tobacco: Never Used  . Alcohol Use: No  . Drug Use: No     Comment: every other week  . Sexual Activity: No   Other Topics Concern  . Not on file   Social History Narrative    No family history on file.  No Known Allergies  Prescriptions prior to admission  Medication Sig Dispense Refill Last Dose  . Prenatal Vit-Fe Fumarate-FA (PRENATAL MULTIVITAMIN) TABS Take 1 tablet by mouth daily at 12 noon.   08/21/2015 at Unknown time  . HYDROcodone-acetaminophen (NORCO/VICODIN) 5-325 MG per tablet Take 1 tablet by mouth every 4 (four) hours as needed for  moderate pain or severe pain. 10 tablet 0   . ibuprofen (ADVIL,MOTRIN) 600 MG tablet Take 1 tablet (600 mg total) by mouth every 6 (six) hours. 30 tablet 0   . ibuprofen (ADVIL,MOTRIN) 800 MG tablet Take 1 tablet (800 mg total) by mouth 3 (three) times daily. 21 tablet 0   . medroxyPROGESTERone (DEPO-PROVERA) 150 MG/ML injection Inject 1 mL (150 mg total) into the muscle every 3 (three) months. (Patient not taking: Reported on 08/22/2015) 1 mL 3 Completed Course  . ondansetron (ZOFRAN-ODT) 4 MG disintegrating tablet Take 4 mg by mouth every 8 (eight) hours as needed for nausea or vomiting.   04/28/2013 at 1500  . [DISCONTINUED] acetaminophen (TYLENOL) 500 MG tablet Take 500 mg by mouth every 6 (six) hours as needed for mild pain or moderate pain.   Past Week at Unknown time    Review of Systems - Negative except pain. Specifically, no dysuria, bowel symptoms, n/v, blood in urine, fever  Vitals:  BP 104/57 mmHg  Pulse 73  Temp(Src) 98.4 F (36.9 C) (Oral)  Resp 16  LMP 02/11/2015 Physical Examination: CONSTITUTIONAL: Well-developed, well-nourished female in no acute distress.  HENT:  Normocephalic, atraumatic EYES: Conjunctivae and EOM  are normal. No scleral icterus.  NECK: Normal range of motion, supple, SKIN: Skin is warm and dry. No rash noted. Not diaphoretic. No erythema. No pallor. NEUROLGIC: Alert and oriented to person, place, and time. No gross cranial nerve deficit noted. PSYCHIATRIC: Normal mood and affect. Normal behavior. Normal judgment and thought content. CARDIOVASCULAR: Normal heart rate noted, regular rhythm RESPIRATORY: Effort and breath sounds normal, no problems with respiration noted ABDOMEN: Soft, nontender, nondistended, gravid.  Cervix: closed/thick/high Membranes:intact Fetal Monitoring:Baseline: 135 bpm, Variability: Good {> 6 bpm), Accelerations: Non-reactive but appropriate for gestational age and Decelerations: Variable: mild Tocometer: occasional  contractions without feeling them  Labs:  Results for orders placed or performed during the hospital encounter of 08/22/15 (from the past 24 hour(s))  Urinalysis complete, with microscopic ([facility] only)   Collection Time: 08/22/15  2:39 PM  Result Value Ref Range   Color, Urine YELLOW (A) YELLOW   APPearance CLEAR (A) CLEAR   Glucose, UA NEGATIVE NEGATIVE mg/dL   Bilirubin Urine NEGATIVE NEGATIVE   Ketones, ur NEGATIVE NEGATIVE mg/dL   Specific Gravity, Urine 1.015 1.005 - 1.030   Hgb urine dipstick NEGATIVE NEGATIVE   pH 7.0 5.0 - 8.0   Protein, ur NEGATIVE NEGATIVE mg/dL   Nitrite NEGATIVE NEGATIVE   Leukocytes, UA NEGATIVE NEGATIVE   RBC / HPF 0-5 0 - 5 RBC/hpf   WBC, UA 0-5 0 - 5 WBC/hpf   Bacteria, UA NONE SEEN NONE SEEN   Squamous Epithelial / LPF 0-5 (A) NONE SEEN   Mucous PRESENT     Imaging Studies: No results found.   Assessment and Plan: Patient Active Problem List   Diagnosis Date Noted  . Labor and delivery, indication for care 08/22/2015  . NSVD (normal spontaneous vaginal delivery) 04/29/2013   Pain, now resolved. No evidence of preterm labor or infection. F/u with routine antenatal care.  Cline Cools, MD, MPH

## 2015-08-22 NOTE — Discharge Instructions (Signed)

## 2015-08-24 LAB — URINE CULTURE

## 2015-09-21 IMAGING — DX DG CHEST 2V
2 series · 2 of 2 positions shown · non-contrast
Comparison: None.

CLINICAL DATA: Initial evaluation for bilateral rib pain when
coughing, radiating into neck, for 2 days

EXAM:
CHEST  2 VIEW

[chest pa]
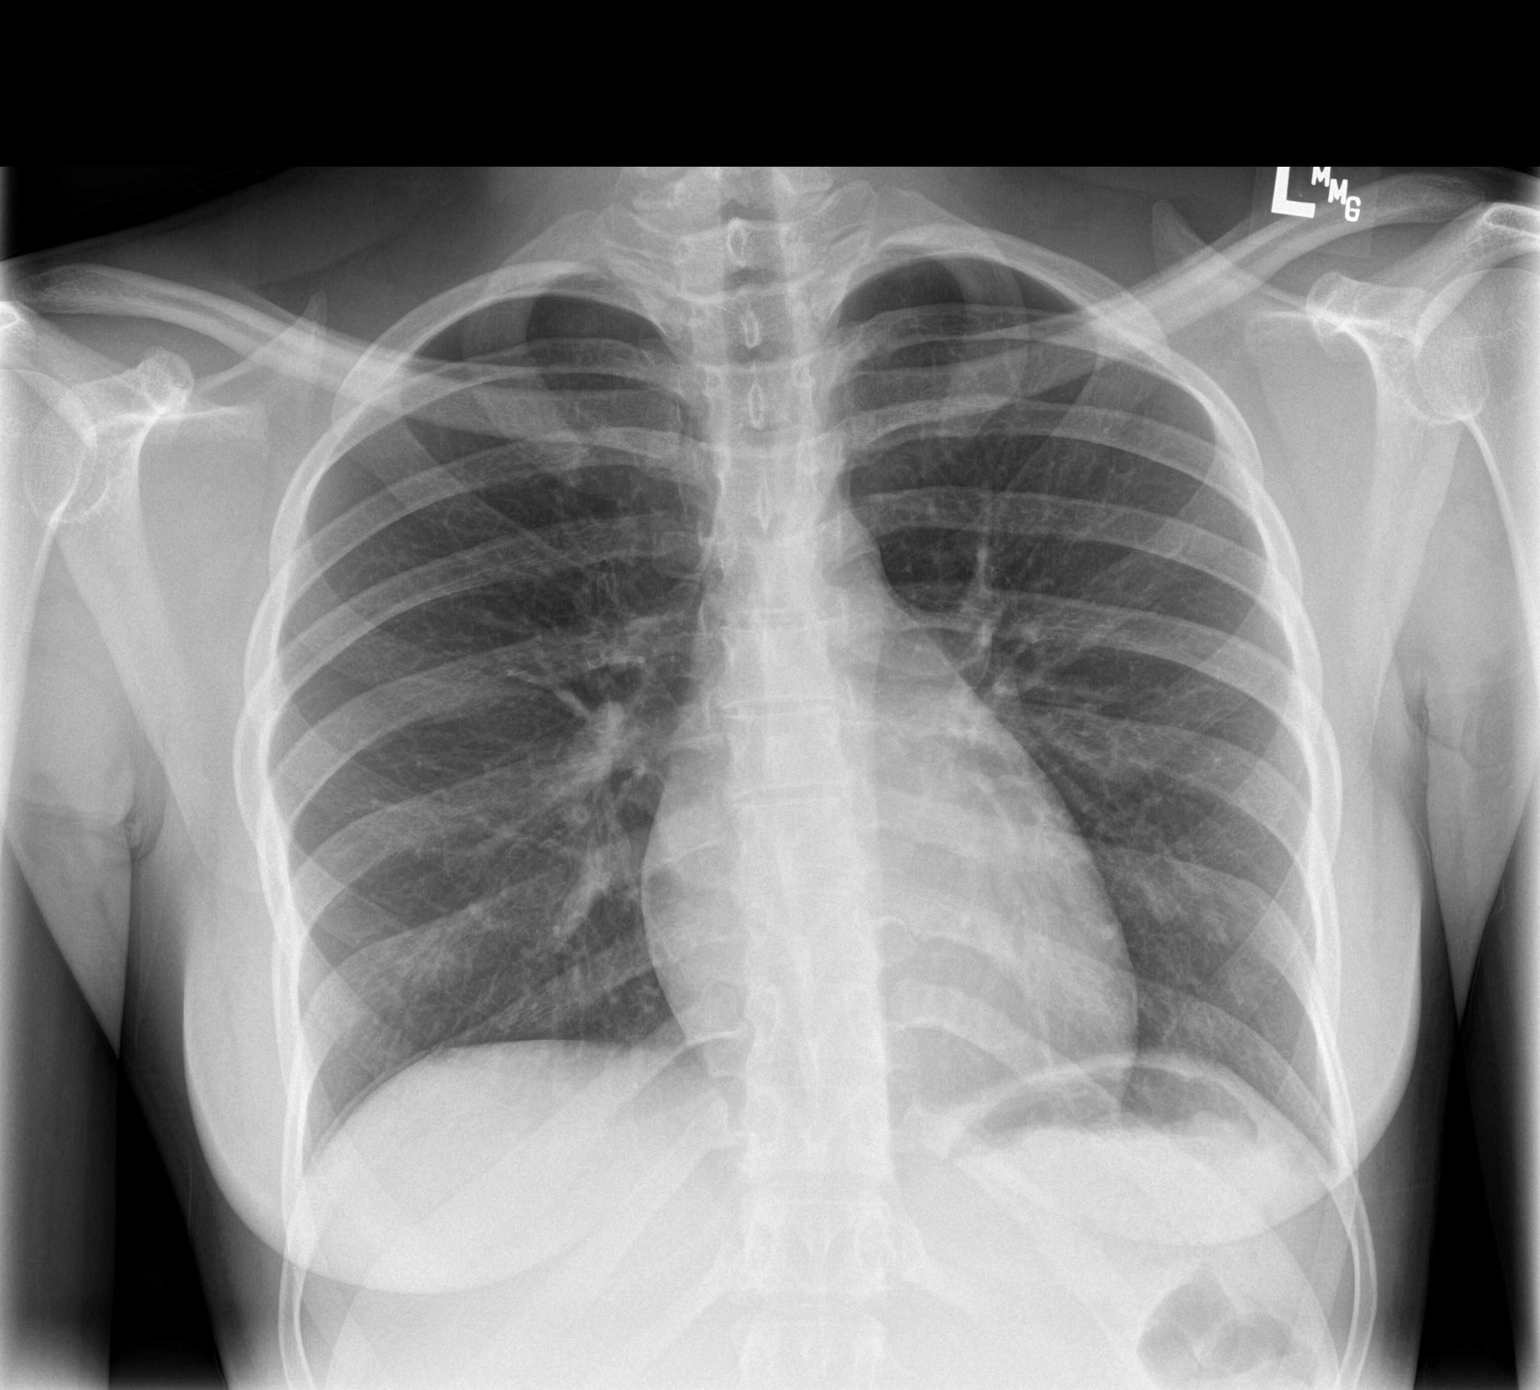

[chest lat]
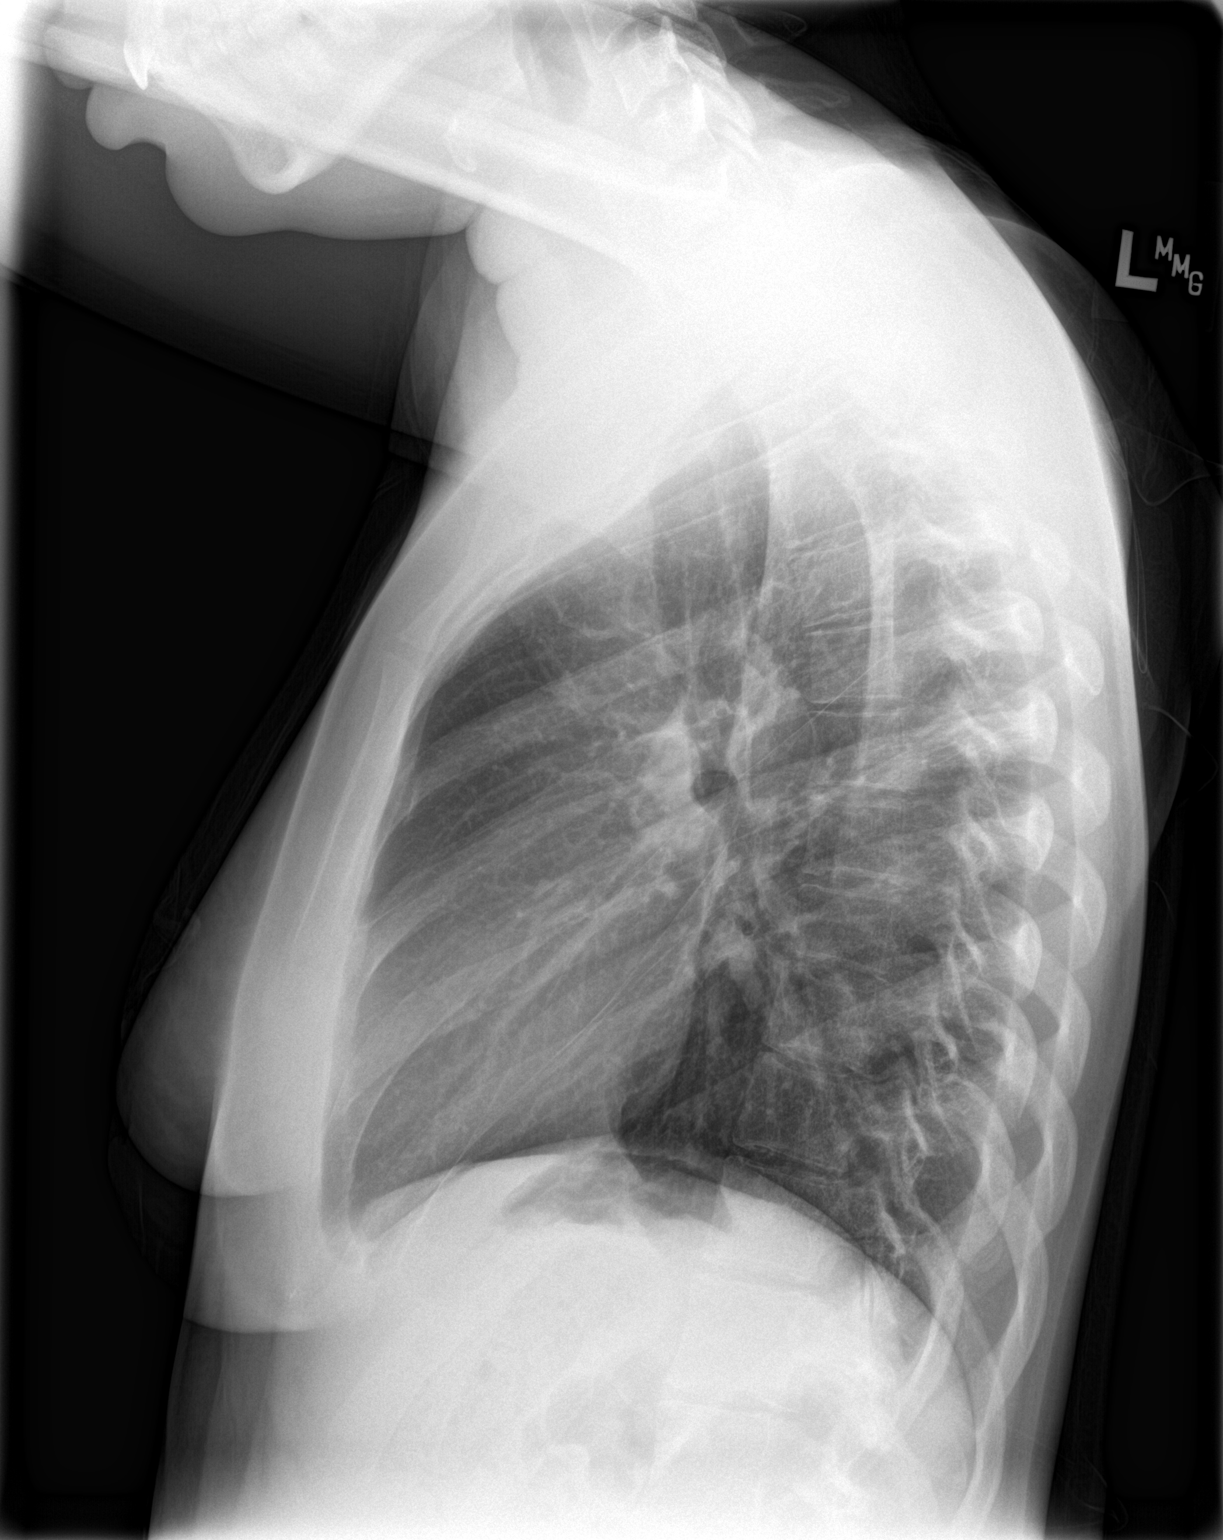

[2 of 2 positions shown; findings below may reference images not displayed]

FINDINGS: The heart size and mediastinal contours are within normal limits.
Both lungs are clear. The visualized skeletal structures are
unremarkable.
IMPRESSION: No active cardiopulmonary disease.

## 2015-10-19 LAB — OB RESULTS CONSOLE GBS: GBS: NEGATIVE

## 2015-11-10 ENCOUNTER — Encounter: Payer: Self-pay | Admitting: *Deleted

## 2015-11-10 ENCOUNTER — Inpatient Hospital Stay
Admission: EM | Admit: 2015-11-10 | Discharge: 2015-11-12 | DRG: 775 | Disposition: A | Discharge status: Home / Self Care | Payer: Medicaid Other | Attending: Obstetrics and Gynecology | Admitting: Obstetrics and Gynecology

## 2015-11-10 ENCOUNTER — Inpatient Hospital Stay: Payer: Medicaid Other | Admitting: Certified Registered Nurse Anesthetist

## 2015-11-10 DIAGNOSIS — Z3A39 39 weeks gestation of pregnancy: Secondary | ICD-10-CM

## 2015-11-10 DIAGNOSIS — Z3483 Encounter for supervision of other normal pregnancy, third trimester: Secondary | ICD-10-CM | POA: Diagnosis present

## 2015-11-10 LAB — CBC
HCT: 30.2 % — ABNORMAL LOW (ref 35.0–47.0)
HEMOGLOBIN: 9.8 g/dL — AB (ref 12.0–16.0)
MCH: 27 pg (ref 26.0–34.0)
MCHC: 32.5 g/dL (ref 32.0–36.0)
MCV: 82.9 fL (ref 80.0–100.0)
Platelets: 121 10*3/uL — ABNORMAL LOW (ref 150–440)
RBC: 3.64 MIL/uL — ABNORMAL LOW (ref 3.80–5.20)
RDW: 15 % — AB (ref 11.5–14.5)
WBC: 9.1 10*3/uL (ref 3.6–11.0)

## 2015-11-10 LAB — ABO/RH: ABO/RH(D): O POS

## 2015-11-10 LAB — TYPE AND SCREEN
ABO/RH(D): O POS
ANTIBODY SCREEN: NEGATIVE

## 2015-11-10 LAB — RAPID HIV SCREEN (HIV 1/2 AB+AG)
HIV 1/2 ANTIBODIES: NONREACTIVE
HIV-1 P24 Antigen - HIV24: NONREACTIVE

## 2015-11-10 MED ORDER — DIPHENHYDRAMINE HCL 50 MG/ML IJ SOLN: 12.5000 mg | INTRAMUSCULAR | Status: DC | PRN

## 2015-11-10 MED ORDER — ACETAMINOPHEN 325 MG PO TABS: 650.0000 mg | ORAL_TABLET | ORAL | Status: DC | PRN

## 2015-11-10 MED ORDER — COCONUT OIL OIL: 1.0000 "application " | TOPICAL_OIL | Status: DC | PRN

## 2015-11-10 MED ORDER — BUTORPHANOL TARTRATE 1 MG/ML IJ SOLN: 1.0000 mg | INTRAMUSCULAR | Status: DC | PRN

## 2015-11-10 MED ORDER — PRENATAL MULTIVITAMIN CH
1.0000 | ORAL_TABLET | Freq: Every day | ORAL | Status: DC
  Administered 2015-11-11: 1 via ORAL
  Filled 2015-11-10: qty 1

## 2015-11-10 MED ORDER — DIPHENHYDRAMINE HCL 25 MG PO CAPS: 25.0000 mg | ORAL_CAPSULE | ORAL | Status: DC | PRN

## 2015-11-10 MED ORDER — BENZOCAINE-MENTHOL 20-0.5 % EX AERO: 1.0000 "application " | INHALATION_SPRAY | CUTANEOUS | Status: DC | PRN

## 2015-11-10 MED ORDER — LIDOCAINE HCL (PF) 1 % IJ SOLN
INTRAMUSCULAR | Status: AC
  Filled 2015-11-10: qty 30

## 2015-11-10 MED ORDER — SODIUM CHLORIDE 0.9 % IV SOLN: 250.0000 mL | INTRAVENOUS | Status: DC | PRN

## 2015-11-10 MED ORDER — LACTATED RINGERS IV SOLN
500.0000 mL | INTRAVENOUS | Status: DC | PRN
  Administered 2015-11-10: 1000 mL via INTRAVENOUS

## 2015-11-10 MED ORDER — LIDOCAINE-EPINEPHRINE (PF) 1.5 %-1:200000 IJ SOLN
INTRAMUSCULAR | Status: DC | PRN
  Administered 2015-11-10: 3 mL via PERINEURAL

## 2015-11-10 MED ORDER — NALBUPHINE HCL 10 MG/ML IJ SOLN: 5.0000 mg | Freq: Once | INTRAMUSCULAR | Status: DC | PRN

## 2015-11-10 MED ORDER — AMMONIA AROMATIC IN INHA
RESPIRATORY_TRACT | Status: DC
Start: 2015-11-10 — End: 2015-11-10
  Filled 2015-11-10: qty 10

## 2015-11-10 MED ORDER — FENTANYL 2.5 MCG/ML W/ROPIVACAINE 0.2% IN NS 100 ML EPIDURAL INFUSION (ARMC-ANES)
EPIDURAL | Status: AC
  Administered 2015-11-10: 9 mL/h via EPIDURAL
  Filled 2015-11-10: qty 100

## 2015-11-10 MED ORDER — NALOXONE HCL 0.4 MG/ML IJ SOLN: 0.4000 mg | INTRAMUSCULAR | Status: DC | PRN

## 2015-11-10 MED ORDER — IBUPROFEN 600 MG PO TABS
600.0000 mg | ORAL_TABLET | Freq: Four times a day (QID) | ORAL | Status: DC
  Administered 2015-11-10 – 2015-11-12 (×7): 600 mg via ORAL
  Filled 2015-11-10 (×7): qty 1

## 2015-11-10 MED ORDER — OXYTOCIN 10 UNIT/ML IJ SOLN
INTRAMUSCULAR | Status: AC
  Filled 2015-11-10: qty 2

## 2015-11-10 MED ORDER — DIPHENHYDRAMINE HCL 25 MG PO CAPS
25.0000 mg | ORAL_CAPSULE | Freq: Four times a day (QID) | ORAL | Status: DC | PRN
Start: 2015-11-10 — End: 2015-11-10

## 2015-11-10 MED ORDER — SODIUM CHLORIDE 0.9% FLUSH: 3.0000 mL | Freq: Two times a day (BID) | INTRAVENOUS | Status: DC

## 2015-11-10 MED ORDER — CITRIC ACID-SODIUM CITRATE 334-500 MG/5ML PO SOLN: 30.0000 mL | ORAL | Status: DC | PRN

## 2015-11-10 MED ORDER — TETANUS-DIPHTH-ACELL PERTUSSIS 5-2.5-18.5 LF-MCG/0.5 IM SUSP: 0.5000 mL | Freq: Once | INTRAMUSCULAR | Status: DC

## 2015-11-10 MED ORDER — DIBUCAINE 1 % RE OINT: 1.0000 "application " | TOPICAL_OINTMENT | RECTAL | Status: DC | PRN

## 2015-11-10 MED ORDER — IBUPROFEN 600 MG PO TABS
ORAL_TABLET | ORAL | Status: AC
  Administered 2015-11-10: 600 mg via ORAL
  Filled 2015-11-10: qty 1

## 2015-11-10 MED ORDER — OXYTOCIN 40 UNITS IN LACTATED RINGERS INFUSION - SIMPLE MED
2.5000 [IU]/h | INTRAVENOUS | Status: DC
  Filled 2015-11-10: qty 1000

## 2015-11-10 MED ORDER — BUPIVACAINE HCL (PF) 0.25 % IJ SOLN
INTRAMUSCULAR | Status: DC | PRN
  Administered 2015-11-10: 3 mL via EPIDURAL
  Administered 2015-11-10: 4 mL via EPIDURAL

## 2015-11-10 MED ORDER — WITCH HAZEL-GLYCERIN EX PADS: 1.0000 "application " | MEDICATED_PAD | CUTANEOUS | Status: DC | PRN

## 2015-11-10 MED ORDER — LIDOCAINE HCL (PF) 1 % IJ SOLN: 30.0000 mL | INTRAMUSCULAR | Status: DC | PRN

## 2015-11-10 MED ORDER — SODIUM CHLORIDE 0.9% FLUSH: 3.0000 mL | INTRAVENOUS | Status: DC | PRN

## 2015-11-10 MED ORDER — NALBUPHINE HCL 10 MG/ML IJ SOLN: 5.0000 mg | INTRAMUSCULAR | Status: DC | PRN

## 2015-11-10 MED ORDER — SENNOSIDES-DOCUSATE SODIUM 8.6-50 MG PO TABS
2.0000 | ORAL_TABLET | ORAL | Status: DC
  Administered 2015-11-10 – 2015-11-12 (×2): 2 via ORAL
  Filled 2015-11-10 (×2): qty 2

## 2015-11-10 MED ORDER — ONDANSETRON HCL 4 MG PO TABS: 4.0000 mg | ORAL_TABLET | ORAL | Status: DC | PRN

## 2015-11-10 MED ORDER — OXYTOCIN BOLUS FROM INFUSION
500.0000 mL | INTRAVENOUS | Status: DC
  Administered 2015-11-10: 500 mL via INTRAVENOUS

## 2015-11-10 MED ORDER — LIDOCAINE HCL (PF) 1 % IJ SOLN
INTRAMUSCULAR | Status: DC | PRN
  Administered 2015-11-10: 3 mL via INTRADERMAL

## 2015-11-10 MED ORDER — FENTANYL 2.5 MCG/ML W/ROPIVACAINE 0.2% IN NS 100 ML EPIDURAL INFUSION (ARMC-ANES): 10.0000 mL/h | EPIDURAL | Status: AC

## 2015-11-10 MED ORDER — MISOPROSTOL 200 MCG PO TABS
ORAL_TABLET | ORAL | Status: AC
  Filled 2015-11-10: qty 4

## 2015-11-10 MED ORDER — LACTATED RINGERS IV SOLN
INTRAVENOUS | Status: DC
  Administered 2015-11-10: 999 mL/h via INTRAVENOUS
  Administered 2015-11-10: 10:00:00 via INTRAVENOUS

## 2015-11-10 MED ORDER — SIMETHICONE 80 MG PO CHEW: 80.0000 mg | CHEWABLE_TABLET | ORAL | Status: DC | PRN

## 2015-11-10 MED ORDER — ONDANSETRON HCL 4 MG/2ML IJ SOLN: 4.0000 mg | Freq: Four times a day (QID) | INTRAMUSCULAR | Status: DC | PRN

## 2015-11-10 MED ORDER — ZOLPIDEM TARTRATE 5 MG PO TABS: 5.0000 mg | ORAL_TABLET | Freq: Every evening | ORAL | Status: DC | PRN

## 2015-11-10 MED ORDER — ONDANSETRON HCL 4 MG/2ML IJ SOLN: 4.0000 mg | INTRAMUSCULAR | Status: DC | PRN

## 2015-11-10 MED ORDER — NALOXONE HCL 2 MG/2ML IJ SOSY: 1.0000 ug/kg/h | PREFILLED_SYRINGE | INTRAVENOUS | Status: DC | PRN

## 2015-11-10 NOTE — Anesthesia Preprocedure Evaluation (Addendum)
Anesthesia Evaluation  Patient identified by MRN, date of birth, ID band Patient awake    Reviewed: Allergy & Precautions, H&P , NPO status , Patient's Chart, lab work & pertinent test results, reviewed documented beta blocker date and time   History of Anesthesia Complications Negative for: history of anesthetic complications  Airway Mallampati: II  TM Distance: >3 FB Neck ROM: full    Dental no notable dental hx. (+) Teeth Intact   Pulmonary neg pulmonary ROS, former smoker,    Pulmonary exam normal breath sounds clear to auscultation       Cardiovascular Exercise Tolerance: Good negative cardio ROS Normal cardiovascular exam Rhythm:regular Rate:Normal     Neuro/Psych negative neurological ROS  negative psych ROS   GI/Hepatic negative GI ROS, Neg liver ROS,   Endo/Other  negative endocrine ROS  Renal/GU negative Renal ROS  negative genitourinary   Musculoskeletal   Abdominal   Peds  Hematology negative hematology ROS (+)   Anesthesia Other Findings   Reproductive/Obstetrics (+) Pregnancy                            Anesthesia Physical Anesthesia Plan  ASA: II  Anesthesia Plan: Epidural   Post-op Pain Management:    Induction:   Airway Management Planned:   Additional Equipment:   Intra-op Plan:   Post-operative Plan:   Informed Consent: I have reviewed the patients History and Physical, chart, labs and discussed the procedure including the risks, benefits and alternatives for the proposed anesthesia with the patient or authorized representative who has indicated his/her understanding and acceptance.     Plan Discussed with: Anesthesiologist  Anesthesia Plan Comments:         Anesthesia Quick Evaluation

## 2015-11-10 NOTE — Progress Notes (Signed)
Bair Hugger removed; temp. 98.2

## 2015-11-10 NOTE — Anesthesia Procedure Notes (Signed)
Epidural Patient location during procedure: OB Start time: 11/10/2015 10:25 AM End time: 11/10/2015 10:40 AM  Staffing Anesthesiologist: Yevette EdwardsADAMS, JAMES G Resident/CRNA: Ginger CarneMICHELET, Rohil Lesch Performed by: anesthesiologist and resident/CRNA   Preanesthetic Checklist Completed: patient identified, site marked, surgical consent, pre-op evaluation, timeout performed, IV checked, risks and benefits discussed and monitors and equipment checked  Epidural Patient position: sitting Prep: Betadine Patient monitoring: heart rate, continuous pulse ox and blood pressure Approach: midline Location: L4-L5 Injection technique: LOR saline  Needle:  Needle type: Tuohy  Needle gauge: 17 G Needle length: 9 cm and 9 Needle insertion depth: 8 cm Catheter type: closed end flexible Catheter size: 19 Gauge Catheter at skin depth: 10 cm Test dose: negative and 1.5% lidocaine with Epi 1:200 K  Assessment Sensory level: T8 Events: blood not aspirated, injection not painful, no injection resistance, negative IV test and no paresthesia  Additional Notes   Patient tolerated the insertion well without immediate complications.Reason for block:procedure for pain

## 2015-11-10 NOTE — Progress Notes (Signed)
Provider notified, orders received. Pt. Admitted, bolus to be given after start of IV.

## 2015-11-10 NOTE — Lactation Note (Signed)
This note was copied from a baby's chart. Lactation Consultation Note  Patient Name: Catherine Bonilla Reason for consult: Initial assessment   Maternal Data Formula Feeding for Exclusion: Yes Does the patient have breastfeeding experience prior to this delivery?: Yes Mom was down as breastfeeding, after delivery mom is cold, under warming blanket, baby also cold,mom  states other babies only breastfed for 1 day until d/c, did not have enough milk.  Assisted with breastfeeding by transition nurse, Diane baby nursing well, but pt discontinued shortly thereafter and states that she desires to formula feed, I informed her that she may try breastfeeding again after she feels better.  Feeding    LATCH Score/Interventions                      Lactation Tools Discussed/Used     Consult Status      Catherine Bonilla Bonilla, 5:35 PM

## 2015-11-10 NOTE — Progress Notes (Signed)
RN unable to obtain oral or axillary temp; four warm blankets applied. RN will re-attempt temp in 15 min.

## 2015-11-10 NOTE — H&P (Signed)
CC: Patient's last menstrual period was 02/11/2015 (exact date). Estimated Date of Delivery: 11/18/15. This is a planned pregnancy. PNC at [facility]. Pt presents in early labor today. CX exam is 3.5 cms/70/vtx-3. No ROM, nol VB or decreased FM today.   ALLERGIES:  Allergies as of 06/27/2015  . (No Known Allergies)   MEDS:  PNC's Past Medical History: No past medical history on file Past Surgical History: No past surgical history on file. Family History  Problem Relation Age of Onset  . Hypertension Mother   Social History: reports that she has never smoked. She does not have any smokeless tobacco history on file. She reports that she does not drink alcohol or use illicit drugs. OB/GYN History:  OB History  Gravida Para Term Preterm AB SAB TAB Ectopic Multiple Living  3 2 2 4    # Outcome Date GA Lbr Len/2nd Weight Sex Delivery Anes PTL Lv  3 Current  2 Term 04/29/13 2.438 kg (5 lb 6 oz) M Vag-Spont Y  1 Term 05/31/10 1.871 kg (4 lb 2 oz) M Vag-Spont Y     GENETIC HX: NO Down syndrome, No Trisomies or spinal bifida on either side of family. No twin or triplet history. 25 yo black female FOB in good health.    ROS: General: No weight loss or weight gain, fatigue,  fever. HEENT: No change in vision, double vision or loss of hearing. No nosebleeds, difficulty swallowing, sore throat or any other complaints.  HEART: No CP,palpatations, swelling in the feet or legs or difficulty breathing while lying flat. LUNGS: No SOB, Cough, Congestion or wheezing. GASTROINTESTINAL: Nausea, vomiting, diarrhea, constipation, heartburn or stomach pain. GENITOURINARY: No urgency, frequency or dysuria. MUSCULOSKELETAL: No joint pain, weakness, cramping or back pain. NEUROLOGIC: No frequent HA's, numbness, blackouts or dizziness. PSYCHIATRIC: No anxiety, panic or depression or increased stress or suicidal ideation. GYN: +UC's today, no LOF or VB.  ENDOCRINE: No heat or cold intolerance, excessive thirst  or hunger. HEMATOLOGIC/LYMPH: No easy bruising or swollen lymph nodes. GYN: No vaginal bleeding, cramping or vaginal odor.  EXAM: VITALS:  Vitals:  06/27/15 1330  BP: 117/70 Pulse: 81  Body mass index is 20.7 kg/(m^2). GENERAL: 25 y.o.yo female in NAD. A & O x 3. HEENT:Normocephalic. No thryomegaly, no nodes. Eyes non-icteric, PERL. Teeth: dentition is good, no missing teeth or periodontal disease noted.no tonsillar enlargement. Face: scar present on forehead. HEART:S1S2, No M/R/G. LUNGS:CTA bilat. No W/R/R. ABD: No organomegaly, No umbilical hernia.  Ext Genitalia: Clear, no BUS. Labia: no condyloma present nor erythema. Extremities: no Edema, varicose veins or rashes present. Skin: Warm and dry to touch. No rashes or lesions present.  Prenatal labs: GC/CH neg, O pos, RPR NR, Hgb solubility neg, VI, HBsAG neg, RI, AFP neg GBS neg US: Normal Anatomy  A: IUP at 39 1/7 weeks  2. Admit if cx changes in 1-2 hours P: Admit to observation for now.  2. No GBS seen in care everywhere so RN can call office or [facility]. GBS neg

## 2015-11-10 NOTE — OB Triage Note (Signed)
Pt here for r/o labor, starting contracting this a.m. At 0400; membrane intact, no bleeding, abd. Soft. Between contractions. Pt. Rate pain with ctx 9/10.

## 2015-11-11 LAB — CBC
HEMATOCRIT: 31.4 % — AB (ref 35.0–47.0)
HEMOGLOBIN: 10.4 g/dL — AB (ref 12.0–16.0)
MCH: 27.6 pg (ref 26.0–34.0)
MCHC: 33.1 g/dL (ref 32.0–36.0)
MCV: 83.4 fL (ref 80.0–100.0)
Platelets: 123 10*3/uL — ABNORMAL LOW (ref 150–440)
RBC: 3.77 MIL/uL — ABNORMAL LOW (ref 3.80–5.20)
RDW: 15.1 % — AB (ref 11.5–14.5)
WBC: 10.9 10*3/uL (ref 3.6–11.0)

## 2015-11-11 LAB — RPR: RPR Ser Ql: NONREACTIVE

## 2015-11-11 MED ORDER — MEDROXYPROGESTERONE ACETATE 150 MG/ML IM SUSP
150.0000 mg | Freq: Once | INTRAMUSCULAR | Status: AC
  Administered 2015-11-12: 150 mg via INTRAMUSCULAR
  Filled 2015-11-11: qty 1

## 2015-11-11 NOTE — Anesthesia Post-op Follow-up Note (Signed)
  Anesthesia Pain Follow-up Note  Patient: Catherine MedalDeanna L Orman  Day #: 1  Date of Follow-up: 11/11/2015 Time: 12:48 PM  Last Vitals:  Filed Vitals:   11/11/15 0444 11/11/15 0810  BP: 101/70 104/63  Pulse: 81 74  Temp: 36.8 C 37 C  Resp: 18 20    Level of Consciousness: alert  Pain: mild   Side Effects:None  Catheter Site Exam:clean, dry, no drainage  Plan: D/C from anesthesia care  Cleda MccreedyJoseph K Piscitello

## 2015-11-11 NOTE — Progress Notes (Signed)
Post Partum Day 1 Subjective: no complaints, up ad lib, voiding, tolerating PO and + flatus  Objective: Blood pressure 104/63, pulse 74, temperature 98.6 F (37 C), temperature source Oral, resp. rate 20, height 5\' 3"  (1.6 m), weight 59.875 kg (132 lb), last menstrual period 02/11/2015, SpO2 99 %, unknown if currently breastfeeding.  Physical Exam:  General: alert, cooperative and no distress Lochia: appropriate Uterine Fundus: firm Incision: none DVT Evaluation: No evidence of DVT seen on physical exam.   Recent Labs  11/10/15 0900 11/11/15 0603  HGB 9.8* 10.4*  HCT 30.2* 31.4*    Assessment/Plan: [facility]   LOS: 1 day   Ala DachJohanna K Halfon 11/11/2015, 10:23 AM

## 2015-11-11 NOTE — Anesthesia Postprocedure Evaluation (Signed)
Anesthesia Post Note  Patient: Catherine MedalDeanna L Bonilla  Procedure(s) Performed: * No procedures listed *  Patient location during evaluation: Mother Baby Anesthesia Type: Epidural Level of consciousness: awake and alert Pain management: pain level controlled Vital Signs Assessment: post-procedure vital signs reviewed and stable Respiratory status: spontaneous breathing, nonlabored ventilation and respiratory function stable Cardiovascular status: stable Postop Assessment: no headache, no backache and epidural receding Anesthetic complications: no    Last Vitals:  Filed Vitals:   11/11/15 0444 11/11/15 0810  BP: 101/70 104/63  Pulse: 81 74  Temp: 36.8 C 37 C  Resp: 18 20    Last Pain:  Filed Vitals:   11/11/15 0848  PainSc: 0-No pain                 Cleda MccreedyJoseph K Piscitello

## 2015-11-12 MED ORDER — IBUPROFEN 600 MG PO TABS: 600.0000 mg | ORAL_TABLET | Freq: Four times a day (QID) | ORAL | Status: AC

## 2015-11-12 NOTE — Discharge Instructions (Signed)
Care After Vaginal Delivery °Congratulations on your new baby!! ° °Refer to this sheet in the next few weeks. These discharge instructions provide you with information on caring for yourself after delivery. Your caregiver may also give you specific instructions. Your treatment has been planned according to the most current medical practices available, but problems sometimes occur. Call your caregiver if you have any problems or questions after you go home. ° °HOME CARE INSTRUCTIONS °· Take over-the-counter or prescription medicines only as directed by your caregiver or pharmacist. °· Do not drink alcohol, especially if you are breastfeeding or taking medicine to relieve pain. °· Do not chew or smoke tobacco. °· Do not use illegal drugs. °· Continue to use good perineal care. Good perineal care includes: °¨ Wiping your perineum from front to back. °¨ Keeping your perineum clean. °· Do not use tampons or douche until your caregiver says it is okay. °· Shower, wash your hair, and take tub baths as directed by your caregiver. °· Wear a well-fitting bra that provides breast support. °· Eat healthy foods. °· Drink enough fluids to keep your urine clear or pale yellow. °· Eat high-fiber foods such as whole grain cereals and breads, brown rice, beans, and fresh fruits and vegetables every day. These foods may help prevent or relieve constipation. °· Follow your caregiver's recommendations regarding resumption of activities such as climbing stairs, driving, lifting, exercising, or traveling. Specifically, no driving for two weeks, so that you are comfortable reacting quickly in an emergency. °· Talk to your caregiver about resuming sexual activities. Resumption of sexual activities is dependent upon your risk of infection, your rate of healing, and your comfort and desire to resume sexual activity. Usually we recommend waiting about six weeks, or until your bleeding stops and you are interested in sex. °· Try to have someone  help you with your household activities and your newborn for at least a few days after you leave the hospital. Even longer is better. °· Rest as much as possible. Try to rest or take a nap when your newborn is sleeping. Sleep deprivation can be very hard after delivery. °· Increase your activities gradually. °· Keep all of your scheduled postpartum appointments. It is very important to keep your scheduled follow-up appointments. At these appointments, your caregiver will be checking to make sure that you are healing physically and emotionally. ° °SEEK MEDICAL CARE IF:  °· You are passing large clots from your vagina.  °· You have a foul smelling discharge from your vagina. °· You have trouble urinating. °· You are urinating frequently. °· You have pain when you urinate. °· You have a change in your bowel movements. °· You have increasing redness, pain, or swelling near your vaginal incision (episiotomy) or vaginal tear. °· You have pus draining from your episiotomy or vaginal tear. °· Your episiotomy or vaginal tear is separating. °· You have painful, hard, or reddened breasts. °· You have a severe headache. °· You have blurred vision or see spots. °· You feel sad or depressed. °· You have thoughts of hurting yourself or your newborn. °· You have questions about your care, the care of your newborn, or medicines. °· You are dizzy or light-headed. °· You have a rash. °· You have nausea or vomiting. °· You were breastfeeding and have not had a menstrual period within 12 weeks after you stopped breastfeeding. °· You are not breastfeeding and have not had a menstrual period by the 12th week after delivery. °· You   have a fever. ° °SEEK IMMEDIATE MEDICAL CARE IF:  °· You have persistent pain. °· You have chest pain. °· You have shortness of breath. °· You faint. °· You have leg pain. °· You have stomach pain. °· Your vaginal bleeding saturates two or more sanitary pads in 1 hour. ° °MAKE SURE YOU:  °· Understand these  instructions. °· Will get help right away if you are not doing well or get worse. °·  °Document Released: 06/07/2000 Document Revised: 10/25/2013 Document Reviewed: 02/05/2012 ° °[employer]® Patient Information ©2015 [employer]. This information is not intended to replace advice given to you by your health care provider. Make sure you discuss any questions you have with your health care provider. ° °

## 2015-11-12 NOTE — Progress Notes (Signed)
Discharged to home to car via wheelchair 

## 2015-11-12 NOTE — Progress Notes (Signed)
Discharge instructions reviewed with pt.  Verbalizes u/o

## 2015-11-12 NOTE — Discharge Summary (Signed)
Obstetric Discharge Summary Reason for Admission: onset of labor Prenatal Procedures: ultrasound Intrapartum Procedures: spontaneous vaginal delivery Postpartum Procedures: Depo Complications-Operative and Postpartum: none HEMOGLOBIN  Date Value Ref Range Status  11/11/2015 10.4* 12.0 - 16.0 g/dL Final  69/62/952806/05/2013 41.310.1 g/dL Final   HCT  Date Value Ref Range Status  11/11/2015 31.4* 35.0 - 47.0 % Final  12/03/2012 31 % Final    Physical Exam:  General: alert, cooperative and appears stated age Lochia: appropriate Uterine Fundus: firm Incision: none  DVT Evaluation: No evidence of DVT seen on physical exam. Negative Homan's sign.  Discharge Diagnoses: Term Pregnancy-delivered  Discharge Information: Date: 11/12/2015 Activity: pelvic rest Diet: routine Medications: Ibuprofen Condition: stable Instructions: refer to practice specific booklet Discharge to: home Follow-up Information    Follow up with Ala DachJohanna K Halfon, MD In 6 weeks.   Specialty:  Obstetrics and Gynecology   Why:  For postpartum visit   Contact information:   [address] [place] [address] [phone]       Newborn Data: Live born female  Birth Weight: 6 lb 8.1 oz (2950 g) APGAR: ,   Home with mother.  Christeen DouglasBEASLEY, Lonza Shimabukuro 11/12/2015, 10:38 AM

## 2017-08-06 ENCOUNTER — Other Ambulatory Visit: Payer: Self-pay

## 2017-08-06 ENCOUNTER — Emergency Department (HOSPITAL_COMMUNITY): Admission: EM | Admit: 2017-08-06 | Discharge: 2017-08-06 | Discharge status: Against Medical Advice | Payer: Self-pay

## 2017-08-06 NOTE — ED Notes (Signed)
Called x1 with  No answer in lobby

## 2023-12-24 ENCOUNTER — Ambulatory Visit: Payer: Self-pay

## 2024-01-02 ENCOUNTER — Ambulatory Visit: Payer: Self-pay
